# Patient Record
Sex: Female | Born: 1935 | Race: White | Hispanic: No | Smoking: Former smoker
Health system: Southern US, Community
[De-identification: ages and names within clinical notes are randomized; demographics above are authoritative.]

## PROBLEM LIST (undated history)

## (undated) DIAGNOSIS — I739 Peripheral vascular disease, unspecified: Secondary | ICD-10-CM

## (undated) DIAGNOSIS — M109 Gout, unspecified: Secondary | ICD-10-CM

## (undated) DIAGNOSIS — M1990 Unspecified osteoarthritis, unspecified site: Secondary | ICD-10-CM

## (undated) DIAGNOSIS — Z8349 Family history of other endocrine, nutritional and metabolic diseases: Secondary | ICD-10-CM

## (undated) DIAGNOSIS — I519 Heart disease, unspecified: Secondary | ICD-10-CM

## (undated) DIAGNOSIS — I2510 Atherosclerotic heart disease of native coronary artery without angina pectoris: Secondary | ICD-10-CM

## (undated) DIAGNOSIS — E039 Hypothyroidism, unspecified: Secondary | ICD-10-CM

## (undated) DIAGNOSIS — E785 Hyperlipidemia, unspecified: Secondary | ICD-10-CM

## (undated) DIAGNOSIS — K5792 Diverticulitis of intestine, part unspecified, without perforation or abscess without bleeding: Secondary | ICD-10-CM

## (undated) DIAGNOSIS — I209 Angina pectoris, unspecified: Secondary | ICD-10-CM

## (undated) DIAGNOSIS — R0602 Shortness of breath: Secondary | ICD-10-CM

## (undated) DIAGNOSIS — I10 Essential (primary) hypertension: Secondary | ICD-10-CM

## (undated) HISTORY — PX: CATARACT EXTRACTION W/ INTRAOCULAR LENS  IMPLANT, BILATERAL: SHX1307

## (undated) HISTORY — PX: ANGIOPLASTY: SHX39

## (undated) HISTORY — PX: APPENDECTOMY: SHX54

## (undated) HISTORY — DX: Family history of other endocrine, nutritional and metabolic diseases: Z83.49

## (undated) HISTORY — DX: Heart disease, unspecified: I51.9

## (undated) HISTORY — DX: Diverticulitis of intestine, part unspecified, without perforation or abscess without bleeding: K57.92

## (undated) HISTORY — DX: Essential (primary) hypertension: I10

---
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
## 2011-07-03 LAB — LIPID PANEL
Cholesterol: 161 mg/dL (ref 0–200)
HDL: 38 mg/dL (ref 35–70)
LDL Cholesterol: 82 mg/dL
Triglycerides: 206 mg/dL — AB (ref 40–160)

## 2012-04-01 LAB — BASIC METABOLIC PANEL: Potassium: 4.1 mmol/L (ref 3.4–5.3)

## 2012-10-04 ENCOUNTER — Encounter: Payer: Self-pay | Admitting: Family Medicine

## 2012-10-04 ENCOUNTER — Ambulatory Visit (INDEPENDENT_AMBULATORY_CARE_PROVIDER_SITE_OTHER): Payer: Medicare Other | Admitting: Family Medicine

## 2012-10-04 VITALS — BP 117/55 | HR 78 | Ht <= 58 in | Wt 143.0 lb

## 2012-10-04 DIAGNOSIS — M1710 Unilateral primary osteoarthritis, unspecified knee: Secondary | ICD-10-CM | POA: Insufficient documentation

## 2012-10-04 DIAGNOSIS — I2510 Atherosclerotic heart disease of native coronary artery without angina pectoris: Secondary | ICD-10-CM | POA: Insufficient documentation

## 2012-10-04 DIAGNOSIS — E039 Hypothyroidism, unspecified: Secondary | ICD-10-CM

## 2012-10-04 DIAGNOSIS — I10 Essential (primary) hypertension: Secondary | ICD-10-CM | POA: Insufficient documentation

## 2012-10-04 DIAGNOSIS — E785 Hyperlipidemia, unspecified: Secondary | ICD-10-CM

## 2012-10-04 DIAGNOSIS — Z79899 Other long term (current) drug therapy: Secondary | ICD-10-CM

## 2012-10-04 NOTE — Progress Notes (Signed)
CC: Natalie Hogan is a 79 y.o. female is here for Establish Care   Subjective: HPI:  Pleasant 79 year old here to establish care, temporary living here with family, may move back to [place] soon. Seen in local emergency room 2 weeks ago for diarrhea which resolved 5 days ago now having one daily bowel movement without blood or tar-like appearance  History of heart disease, she has trouble describing specifics but it sounds like a diagnosis of coronary artery disease. Currently taking metoprolol and isosorbide daily. Baby aspirin a day. Denies exertional chest pain nor limb claudication.  History of hypothyroidism taking 25 mcg levothyroxine a day. Denies unintentional weight loss or gain, diarrhea, constipation, anxiety, depression, tremor  History of hyperlipidemia. Has been taking Zocor for over a year, last cholesterol panel over a year ago. Denies right upper quadrant pain, skin or scleral discoloration, nor myalgias  Complains of bilateral knee pain for years. Described as a soreness the more she walks improves with rest. Denies catching locking or giving way of either knee.  Had a cortisone shot 6 months ago with mild improvement. Pain is now mild to moderate without any swelling or redness nor warmth. Denies weakness or joint pain elsewhere  Review of Systems - General ROS: negative for - chills, fever, night sweats, weight gain or weight loss Ophthalmic ROS: negative for - decreased vision Psychological ROS: negative for - anxiety or depression ENT ROS: negative for - hearing change, nasal congestion, tinnitus or allergies Hematological and Lymphatic ROS: negative for - bleeding problems, bruising or swollen lymph nodes Breast ROS: negative Respiratory ROS: no cough, shortness of breath, or wheezing Cardiovascular ROS: no chest pain or dyspnea on exertion Gastrointestinal ROS: no abdominal pain, change in bowel habits, or black or bloody stools Genito-Urinary ROS: negative for  - genital discharge, genital ulcers, incontinence or abnormal bleeding from genitals Musculoskeletal ROS: negative for - joint pain or muscle pain other than that described above Neurological ROS: negative for - headaches or memory loss Dermatological ROS: negative for lumps, mole changes, rash and skin lesion changes  Past Medical History  Diagnosis Date  . Heart disease   . Hypertension   . Family history of thyroid problem      Family History  Problem Relation Age of Onset  . Hypertension Son      History  Substance Use Topics  . Smoking status: Former Smoker    Quit date: 10/05/1967  . Smokeless tobacco: Not on file  . Alcohol Use: No     Objective: Filed Vitals:   10/04/12 1123  BP: 117/55  Pulse: 78    General: Alert and Oriented, No Acute Distress HEENT: Pupils equal, round, reactive to light. Conjunctivae clear. Moist mucous membranes Lungs: Clear to auscultation bilaterally, no wheezing/ronchi/rales.  Comfortable work of breathing. Good air movement. Cardiac: Regular rate and rhythm. Normal S1/S2.  No murmurs, rubs, nor gallops.   Abdomen: Normal bowel sounds, soft and non tender without palpable masses. Extremities: No peripheral edema.  Strong peripheral pulses. Both knees have full range of motion and strength with no pain on palpation no valgus or varus laxity, no crepitus, no swelling redness or warmth Mental Status: No depression, anxiety, nor agitation. Skin: Warm and dry.  Assessment & Plan: Natalie Hogan was seen today for establish care.  Diagnoses and associated orders for this visit:  CAD (coronary artery disease)  Essential hypertension, benign - COMPLETE METABOLIC PANEL WITH GFR  Hypothyroidism - TSH  Knee osteoarthritis  Hyperlipidemia LDL goal <70 -  Lipid panel  Encounter for monitoring statin therapy - Lipid panel - COMPLETE METABOLIC PANEL WITH GFR  Other Orders - Isosorbide Mononitrate (ISMO PO); Take by mouth. - simvastatin (ZOCOR)  20 MG tablet; Take 20 mg by mouth every evening. - metoprolol succinate (TOPROL-XL) 25 MG 24 hr tablet; Take 25 mg by mouth daily. - levothyroxine (SYNTHROID, LEVOTHROID) 25 MCG tablet; Take 25 mcg by mouth daily before breakfast. - celecoxib (CELEBREX) 200 MG capsule; Take 200 mg by mouth 2 (two) times daily. - aspirin 81 MG tablet; Take 81 mg by mouth daily.    Hypothyroidism: Clinically controlled, due for TSH Essential hypertension: Controlled continue current med regimen Coronary artery disease: Controlled, continue nitrates and metoprolol Hyperlipidemia: Due for lipid profile, checking liver function Bilateral knee osteoarthritis: Patient would like bilateral steroid injection for pain management  Return in about 3 months (around 01/04/2013).  Knee Injection Procedure Note  Pre-operative Diagnosis: bilateral osteoarthritis  Post-operative Diagnosis: same  Indications: pain  Anesthesia: topical cold spray  Procedure Details   Verbal consent was obtained for the procedure. The joints was prepped with alcohol. A 22 gauge needle was inserted into the inferior medial (right knee) and inferior lateral (left knee) of the joint. 3 ml 1% lidocaine and 2 ml of triamcinolone (KENALOG) 40mg /ml was then injected into the joint through the same needle. The needle was removed and the area cleansed and dressed.  Complications:  None; patient tolerated the procedure well.

## 2012-10-12 ENCOUNTER — Inpatient Hospital Stay (HOSPITAL_BASED_OUTPATIENT_CLINIC_OR_DEPARTMENT_OTHER)
Admission: EM | Admit: 2012-10-12 | Discharge: 2012-10-14 | DRG: 683 | Disposition: A | Discharge status: Home / Self Care | Payer: Medicare Other | Attending: Internal Medicine | Admitting: Internal Medicine

## 2012-10-12 ENCOUNTER — Emergency Department (HOSPITAL_BASED_OUTPATIENT_CLINIC_OR_DEPARTMENT_OTHER): Payer: Medicare Other

## 2012-10-12 ENCOUNTER — Encounter: Payer: Self-pay | Admitting: *Deleted

## 2012-10-12 ENCOUNTER — Emergency Department (INDEPENDENT_AMBULATORY_CARE_PROVIDER_SITE_OTHER)
Admission: EM | Admit: 2012-10-12 | Discharge: 2012-10-12 | Disposition: A | Discharge status: Home / Self Care | Payer: Medicare Other | Source: Home / Self Care | Attending: Family Medicine | Admitting: Family Medicine

## 2012-10-12 ENCOUNTER — Other Ambulatory Visit: Payer: Self-pay | Admitting: Family Medicine

## 2012-10-12 ENCOUNTER — Encounter (HOSPITAL_BASED_OUTPATIENT_CLINIC_OR_DEPARTMENT_OTHER): Payer: Self-pay

## 2012-10-12 ENCOUNTER — Ambulatory Visit (HOSPITAL_BASED_OUTPATIENT_CLINIC_OR_DEPARTMENT_OTHER): Admit: 2012-10-12 | Payer: Medicare Other

## 2012-10-12 DIAGNOSIS — D696 Thrombocytopenia, unspecified: Secondary | ICD-10-CM | POA: Diagnosis present

## 2012-10-12 DIAGNOSIS — R197 Diarrhea, unspecified: Secondary | ICD-10-CM

## 2012-10-12 DIAGNOSIS — E039 Hypothyroidism, unspecified: Secondary | ICD-10-CM | POA: Diagnosis present

## 2012-10-12 DIAGNOSIS — Z87891 Personal history of nicotine dependence: Secondary | ICD-10-CM

## 2012-10-12 DIAGNOSIS — E875 Hyperkalemia: Secondary | ICD-10-CM

## 2012-10-12 DIAGNOSIS — R109 Unspecified abdominal pain: Secondary | ICD-10-CM

## 2012-10-12 DIAGNOSIS — K5289 Other specified noninfective gastroenteritis and colitis: Secondary | ICD-10-CM | POA: Diagnosis present

## 2012-10-12 DIAGNOSIS — E872 Acidosis, unspecified: Secondary | ICD-10-CM

## 2012-10-12 DIAGNOSIS — E785 Hyperlipidemia, unspecified: Secondary | ICD-10-CM | POA: Diagnosis present

## 2012-10-12 DIAGNOSIS — N289 Disorder of kidney and ureter, unspecified: Secondary | ICD-10-CM

## 2012-10-12 DIAGNOSIS — D649 Anemia, unspecified: Secondary | ICD-10-CM

## 2012-10-12 DIAGNOSIS — I2510 Atherosclerotic heart disease of native coronary artery without angina pectoris: Secondary | ICD-10-CM | POA: Diagnosis present

## 2012-10-12 DIAGNOSIS — I10 Essential (primary) hypertension: Secondary | ICD-10-CM | POA: Diagnosis present

## 2012-10-12 DIAGNOSIS — N179 Acute kidney failure, unspecified: Principal | ICD-10-CM

## 2012-10-12 DIAGNOSIS — I519 Heart disease, unspecified: Secondary | ICD-10-CM | POA: Diagnosis present

## 2012-10-12 HISTORY — DX: Angina pectoris, unspecified: I20.9

## 2012-10-12 HISTORY — DX: Shortness of breath: R06.02

## 2012-10-12 HISTORY — DX: Hypothyroidism, unspecified: E03.9

## 2012-10-12 HISTORY — DX: Unspecified osteoarthritis, unspecified site: M19.90

## 2012-10-12 HISTORY — DX: Peripheral vascular disease, unspecified: I73.9

## 2012-10-12 HISTORY — DX: Hyperlipidemia, unspecified: E78.5

## 2012-10-12 HISTORY — DX: Gout, unspecified: M10.9

## 2012-10-12 HISTORY — DX: Atherosclerotic heart disease of native coronary artery without angina pectoris: I25.10

## 2012-10-12 LAB — POCT URINALYSIS DIP (MANUAL ENTRY)
Bilirubin, UA: NEGATIVE
Glucose, UA: NEGATIVE
Nitrite, UA: NEGATIVE
Urobilinogen, UA: 0.2 (ref 0–1)
pH, UA: 5.5 (ref 5–8)

## 2012-10-12 LAB — POCT CBC W AUTO DIFF (K'VILLE URGENT CARE)

## 2012-10-12 LAB — CBC WITH DIFFERENTIAL/PLATELET
Basophils Absolute: 0 10*3/uL (ref 0.0–0.1)
Basophils Relative: 0 % (ref 0–1)
Eosinophils Absolute: 0 10*3/uL (ref 0.0–0.7)
MCH: 32.6 pg (ref 26.0–34.0)
MCHC: 33.5 g/dL (ref 30.0–36.0)
Monocytes Relative: 9 % (ref 3–12)
Neutro Abs: 5.3 10*3/uL (ref 1.7–7.7)
Neutrophils Relative %: 70 % (ref 43–77)
RDW: 15.4 % (ref 11.5–15.5)

## 2012-10-12 LAB — COMPREHENSIVE METABOLIC PANEL
AST: 30 U/L (ref 0–37)
Albumin: 4.1 g/dL (ref 3.5–5.2)
Albumin: 3.9 g/dL (ref 3.5–5.2)
Alkaline Phosphatase: 100 U/L (ref 39–117)
BUN: 85 mg/dL — ABNORMAL HIGH (ref 6–23)
BUN: 87 mg/dL — ABNORMAL HIGH (ref 6–23)
CO2: 18 mEq/L — ABNORMAL LOW (ref 19–32)
Calcium: 8.8 mg/dL (ref 8.4–10.5)
Chloride: 111 mEq/L (ref 96–112)
Chloride: 103 mEq/L (ref 96–112)
Creatinine, Ser: 2.1 mg/dL — ABNORMAL HIGH (ref 0.50–1.10)
Glucose, Bld: 96 mg/dL (ref 70–99)
Potassium: 6.9 mEq/L (ref 3.5–5.3)
Potassium: 6.7 mEq/L (ref 3.5–5.1)
Sodium: 136 mEq/L (ref 135–145)
Total Bilirubin: 0.4 mg/dL (ref 0.3–1.2)
Total Protein: 6.4 g/dL (ref 6.0–8.3)
Total Protein: 7 g/dL (ref 6.0–8.3)

## 2012-10-12 LAB — CBC
MCH: 32.5 pg (ref 26.0–34.0)
MCV: 95.3 fL (ref 78.0–100.0)
Platelets: 124 10*3/uL — ABNORMAL LOW (ref 150–400)
RBC: 3.42 MIL/uL — ABNORMAL LOW (ref 3.87–5.11)

## 2012-10-12 LAB — BASIC METABOLIC PANEL
BUN: 74 mg/dL — ABNORMAL HIGH (ref 6–23)
CO2: 17 mEq/L — ABNORMAL LOW (ref 19–32)
Calcium: 8.8 mg/dL (ref 8.4–10.5)
Glucose, Bld: 98 mg/dL (ref 70–99)
Sodium: 137 mEq/L (ref 135–145)

## 2012-10-12 LAB — OCCULT BLOOD X 1 CARD TO LAB, STOOL: Fecal Occult Bld: POSITIVE — AB

## 2012-10-12 LAB — CLOSTRIDIUM DIFFICILE BY PCR: Toxigenic C. Difficile by PCR: NEGATIVE

## 2012-10-12 MED ORDER — ASPIRIN 81 MG PO TABS: 81.0000 mg | ORAL_TABLET | Freq: Every day | ORAL | Status: DC

## 2012-10-12 MED ORDER — ONDANSETRON HCL 4 MG PO TABS: 4.0000 mg | ORAL_TABLET | Freq: Four times a day (QID) | ORAL | Status: DC | PRN

## 2012-10-12 MED ORDER — SODIUM BICARBONATE 8.4 % IV SOLN
INTRAVENOUS | Status: AC
  Administered 2012-10-12: 18:00:00
  Filled 2012-10-12: qty 100

## 2012-10-12 MED ORDER — SIMVASTATIN 20 MG PO TABS
20.0000 mg | ORAL_TABLET | Freq: Every evening | ORAL | Status: DC
  Administered 2012-10-12 – 2012-10-14 (×3): 20 mg via ORAL
  Filled 2012-10-12 (×3): qty 1

## 2012-10-12 MED ORDER — ACETAMINOPHEN 325 MG PO TABS: 650.0000 mg | ORAL_TABLET | Freq: Four times a day (QID) | ORAL | Status: DC | PRN

## 2012-10-12 MED ORDER — SODIUM CHLORIDE 0.9 % IJ SOLN
3.0000 mL | Freq: Two times a day (BID) | INTRAMUSCULAR | Status: DC
  Administered 2012-10-12 – 2012-10-14 (×3): 3 mL via INTRAVENOUS

## 2012-10-12 MED ORDER — SODIUM POLYSTYRENE SULFONATE 15 GM/60ML PO SUSP
30.0000 g | Freq: Once | ORAL | Status: AC
  Administered 2012-10-12: 30 g via ORAL
  Filled 2012-10-12: qty 120

## 2012-10-12 MED ORDER — DEXTROSE 50 % IV SOLN
1.0000 | Freq: Once | INTRAVENOUS | Status: AC
  Administered 2012-10-12: 50 mL via INTRAVENOUS
  Filled 2012-10-12: qty 50

## 2012-10-12 MED ORDER — ACETAMINOPHEN 650 MG RE SUPP: 650.0000 mg | Freq: Four times a day (QID) | RECTAL | Status: DC | PRN

## 2012-10-12 MED ORDER — LEVOTHYROXINE SODIUM 25 MCG PO TABS
25.0000 ug | ORAL_TABLET | Freq: Every day | ORAL | Status: DC
  Administered 2012-10-13 – 2012-10-14 (×2): 25 ug via ORAL
  Filled 2012-10-12 (×3): qty 1

## 2012-10-12 MED ORDER — ONDANSETRON HCL 4 MG/2ML IJ SOLN: 4.0000 mg | Freq: Three times a day (TID) | INTRAMUSCULAR | Status: DC | PRN

## 2012-10-12 MED ORDER — METOPROLOL SUCCINATE ER 25 MG PO TB24
25.0000 mg | ORAL_TABLET | Freq: Every day | ORAL | Status: DC
  Administered 2012-10-13: 25 mg via ORAL
  Filled 2012-10-12 (×2): qty 1

## 2012-10-12 MED ORDER — ONDANSETRON HCL 4 MG/2ML IJ SOLN: 4.0000 mg | Freq: Four times a day (QID) | INTRAMUSCULAR | Status: DC | PRN

## 2012-10-12 MED ORDER — ISOSORBIDE MONONITRATE ER 30 MG PO TB24
30.0000 mg | ORAL_TABLET | Freq: Every day | ORAL | Status: DC
  Administered 2012-10-13 – 2012-10-14 (×2): 30 mg via ORAL
  Filled 2012-10-12 (×2): qty 1

## 2012-10-12 MED ORDER — INSULIN ASPART 100 UNIT/ML IV SOLN
10.0000 [IU] | Freq: Once | INTRAVENOUS | Status: AC
  Administered 2012-10-12: 10 [IU] via INTRAVENOUS
  Filled 2012-10-12: qty 1
  Filled 2012-10-12: qty 0.1

## 2012-10-12 MED ORDER — SODIUM BICARBONATE 8.4 % IV SOLN
INTRAVENOUS | Status: AC
  Administered 2012-10-12: 18:00:00
  Filled 2012-10-12: qty 50

## 2012-10-12 MED ORDER — NITROGLYCERIN 0.4 MG/SPRAY TL SOLN
1.0000 | Status: DC | PRN
  Filled 2012-10-12: qty 4.9

## 2012-10-12 MED ORDER — STERILE WATER FOR INJECTION IV SOLN
INTRAVENOUS | Status: DC
  Administered 2012-10-12 – 2012-10-13 (×3): via INTRAVENOUS
  Filled 2012-10-12 (×2): qty 850

## 2012-10-12 MED ORDER — ASPIRIN EC 81 MG PO TBEC
81.0000 mg | DELAYED_RELEASE_TABLET | Freq: Every day | ORAL | Status: DC
  Administered 2012-10-13 – 2012-10-14 (×2): 81 mg via ORAL
  Filled 2012-10-12 (×2): qty 1

## 2012-10-12 MED ORDER — CIPROFLOXACIN HCL 500 MG PO TABS: 500.0000 mg | ORAL_TABLET | Freq: Two times a day (BID) | ORAL | Status: DC

## 2012-10-12 MED ORDER — HYDRALAZINE HCL 20 MG/ML IJ SOLN: 10.0000 mg | INTRAMUSCULAR | Status: DC | PRN

## 2012-10-12 MED ORDER — SODIUM CHLORIDE 0.9 % IV BOLUS (SEPSIS)
1000.0000 mL | Freq: Once | INTRAVENOUS | Status: AC
  Administered 2012-10-12: 1000 mL via INTRAVENOUS

## 2012-10-12 NOTE — ED Notes (Signed)
Report given to anita rn on 6700 at [facility]. pts son was called per his request and notified of pts assigned room number and that she was on her way with [facility].

## 2012-10-12 NOTE — ED Provider Notes (Addendum)
History     CSN: [id number]  Arrival date & time 10/12/12  7829   First MD Initiated Contact with Patient 10/12/12 [id number]      Chief Complaint  Patient presents with  . Diarrhea    HPI  This is a 79 year old female with past medical history of hypertension, hypothyroidism, heart disease who presents today with intermittent diarrhea x2 weeks. Patient states she's had intermittent loose/watery bowel movements that have occurred Thursday throughout the day. Patiently recently relocated to the area from [place]. Was recently seen at primary care across the hall. There is no issues at that time. Patient denies any fevers or chills, no nausea or vomiting. No recent foreign travel. Patient has had some mild flank pain as well as some intermittent dysuria. No unintentional weight loss. Stools have been watery in nature nonbloody. This is never happened before. Patient denies any prior history of GI issues. Patient has had multiple abdominal surgeries including appendectomy and hysterectomy. Patient reports she's never had a colonoscopy.  Past Medical History  Diagnosis Date  . Heart disease   . Hypertension   . Family history of thyroid problem     History reviewed. No pertinent past surgical history.  Family History  Problem Relation Age of Onset  . Hypertension Son     History  Substance Use Topics  . Smoking status: Former Smoker    Quit date: 10/05/1967  . Smokeless tobacco: Never Used  . Alcohol Use: No    OB History   Grav Para Term Preterm Abortions TAB SAB Ect Mult Living                  Review of Systems  All other systems reviewed and are negative.    Allergies  Review of patient's allergies indicates no known allergies.  Home Medications   Current Outpatient Rx  Name  Route  Sig  Dispense  Refill  . aspirin 81 MG tablet   Oral   Take 81 mg by mouth daily.         . celecoxib (CELEBREX) 200 MG capsule   Oral   Take 200 mg by mouth 2  (two) times daily.         . Isosorbide Mononitrate (ISMO PO)   Oral   Take by mouth.         . levothyroxine (SYNTHROID, LEVOTHROID) 25 MCG tablet   Oral   Take 25 mcg by mouth daily before breakfast.         . metoprolol succinate (TOPROL-XL) 25 MG 24 hr tablet   Oral   Take 25 mg by mouth daily.         . simvastatin (ZOCOR) 20 MG tablet   Oral   Take 20 mg by mouth every evening.           BP 131/76  Pulse 75  Temp(Src) 97.8 F (36.6 C) (Oral)  Resp 14  Ht 4\' 10"  (1.473 m)  Wt 139 lb (63.05 kg)  BMI 29.06 kg/m2  SpO2 98%  Physical Exam  Constitutional: She appears well-developed and well-nourished.  HENT:  Head: Normocephalic and atraumatic.  Eyes: Pupils are equal, round, and reactive to light.  Neck: Normal range of motion.  Cardiovascular: Normal rate and regular rhythm.   Pulmonary/Chest: Effort normal and breath sounds normal.  Abdominal: Soft. Bowel sounds are normal.  Mild abdominal distention. No dominant tenderness. Mild bilateral flank pain.  Musculoskeletal: Normal range of motion.  Neurological: She is alert.  Skin: Skin is warm.    ED Course  Procedures (including critical care time)  Labs Reviewed - No data to display No results found. Urinalysis    Component Value Date/Time   BILIRUBINUR negative 10/12/2012 0900   BILIRUBINUR negative 10/12/2012 0900   UROBILINOGEN 0.2 10/12/2012 0900   NITRITE Negative 10/12/2012 0900   LEUKOCYTESUR Trace 10/12/2012 0900       1. Flank pain   2. Diarrhea       MDM  Differential diagnosis for symptoms remains relatively broad including infection, obstruction, medication, malignancy. CBC WNL today.  Mild normocytic anemia with hgb at 11.1 (MCV 98) Will obtain CT scan of the abdomen and pelvis with IV and oral contrast to further assess intra-abdominal anatomy. UA is somewhat indicative of urinary tract infection. Will obtain urine culture. Start patient on Cipro for GU coverage. Case  was discussed with Dr. Rhetta Mura with digestive health specialists and Kathryne Sharper. Agree with plan. We'll hold off on obtaining any stool studies pending CT of the abdomen and pelvis. Plan for followup with gastroenterology within next 1-3 days. Discussed general, gastrointestinal, infectious red flags at length with patient. Followup as needed.     The patient and/or caregiver has been counseled thoroughly with regard to treatment plan and/or medications prescribed including dosage, schedule, interactions, rationale for use, and possible side effects and they verbalize understanding. Diagnoses and expected course of recovery discussed and will return if not improved as expected or if the condition worsens. Patient and/or caregiver verbalized understanding.             Doree Albee, MD 10/12/12 0933   Clinical update:  BMET    Component Value Date/Time   NA 136 10/12/2012 0902   K 6.9* 10/12/2012 0902   CL 111 10/12/2012 0902   CO2 18* 10/12/2012 0902   GLUCOSE 96 10/12/2012 0902   BUN 85* 10/12/2012 0902   CREATININE 2.21* 10/12/2012 0902   CALCIUM 8.8 10/12/2012 0902   Pt with noted hyperkalemia, renal failure, as well as bicarb 18(? Met acidosis).  Hold on CT scan.  Pt's son made aware. Instructed to go to ER next door for further management of this.     Doree Albee, MD 10/12/12 1254  Doree Albee, MD 10/12/12 1255

## 2012-10-12 NOTE — ED Notes (Signed)
Pt reports a 2 week history of diarrhea and abdominal cramping.  She was seen by [facility] today, sent for an outpatient CT and sent to ED for evaluation of renal failure and hyperkalemia.

## 2012-10-12 NOTE — ED Provider Notes (Signed)
History     CSN: [id number]  Arrival date & time 10/12/12  1254   First MD Initiated Contact with Patient 10/12/12 1312      Chief Complaint  Patient presents with  . Diarrhea  . Abdominal Cramping    (Consider location/radiation/quality/duration/timing/severity/associated sxs/prior treatment) HPI 79 y.o. Female complaining of diarrhea for three weeks.  Loose stools 2-3 times per day now very watery.  No fever or chills, no recent antibiotic use, crampy lower abdominal pain with gas and low back pain.  Taking po well, but 10 pound weight loss.   Past Medical History  Diagnosis Date  . Heart disease   . Hypertension   . Family history of thyroid problem     History reviewed. No pertinent past surgical history.  Family History  Problem Relation Age of Onset  . Hypertension Son     History  Substance Use Topics  . Smoking status: Former Smoker    Quit date: 10/05/1967  . Smokeless tobacco: Never Used  . Alcohol Use: No    OB History   Grav Para Term Preterm Abortions TAB SAB Ect Mult Living                  Review of Systems  All other systems reviewed and are negative.    Allergies  Review of patient's allergies indicates no known allergies.  Home Medications   Current Outpatient Rx  Name  Route  Sig  Dispense  Refill  . aspirin 81 MG tablet   Oral   Take 81 mg by mouth daily.         . celecoxib (CELEBREX) 200 MG capsule   Oral   Take 200 mg by mouth 2 (two) times daily.         . ciprofloxacin (CIPRO) 500 MG tablet   Oral   Take 1 tablet (500 mg total) by mouth 2 (two) times daily.   14 tablet   0   . Isosorbide Mononitrate (ISMO PO)   Oral   Take by mouth.         . levothyroxine (SYNTHROID, LEVOTHROID) 25 MCG tablet   Oral   Take 25 mcg by mouth daily before breakfast.         . metoprolol succinate (TOPROL-XL) 25 MG 24 hr tablet   Oral   Take 25 mg by mouth daily.         . simvastatin (ZOCOR) 20 MG tablet   Oral  Take 20 mg by mouth every evening.           BP 148/113  Pulse 76  Temp(Src) 97.5 F (36.4 C) (Oral)  Resp 22  Ht 4\' 10"  (1.473 m)  Wt 139 lb (63.05 kg)  BMI 29.06 kg/m2  SpO2 100%  Physical Exam  Nursing note and vitals reviewed. Constitutional: She is oriented to person, place, and time. She appears well-developed and well-nourished.  HENT:  Head: Normocephalic and atraumatic.  Right Ear: External ear normal.  Left Ear: External ear normal.  Nose: Nose normal.  Mouth/Throat: Oropharynx is clear and moist.  Eyes: Conjunctivae and EOM are normal. Pupils are equal, round, and reactive to light.  Neck: Normal range of motion. Neck supple.  Cardiovascular: Normal rate, regular rhythm, normal heart sounds and intact distal pulses.   Pulmonary/Chest: Effort normal and breath sounds normal.  Abdominal: Soft. Bowel sounds are normal.  Musculoskeletal: Normal range of motion.  Neurological: She is alert and oriented to person, place, and time.  She has normal reflexes.  Skin: Skin is warm and dry.  Psychiatric: She has a normal mood and affect. Her behavior is normal. Thought content normal.    ED Course  Procedures (including critical care time)  Labs Reviewed - No data to display No results found.   No diagnosis found.   Date: 10/12/2012  Rate: 62  Rhythm: normal sinus rhythm  QRS Axis: normal  Intervals: normal  ST/T Wave abnormalities: normal  Conduction Disutrbances: none  Narrative Interpretation: unremarkable      MDM    Patient with abdominal discomfort, diarrhea, renal insufficiency, hyperkalemia, and renal mass.  Plan transfer for further treatment of hyperkalemia, evaluation of renal mass, volume repletion, and further evaluation of rectal bleeding. Patient discussed with Dr. Jerral Ralph and will accept to tele bed.  Patient given iv insulin d50 and bicarb drip started as per Dr. Windell Norfolk request.       Hilario Quarry, MD 10/12/12 [id number]

## 2012-10-12 NOTE — Progress Notes (Signed)
79 yo lady with CAD, Hypothyroidism with diarrhea-labs show ARF with Hyperkalemia and met acidosis. No EKG changes.Otherwise stable, ED MD Dr Rosalia Hammers gave patient Kayexalate-I asked Dr Rosalia Hammers to given one dose of IV insulin and also to place on Bicarb infusion. She has been accepted to Telemetry bed.Please recheck BMET when patient gets to [facility]. She is from [place]-and is here visiting her son.

## 2012-10-12 NOTE — ED Notes (Signed)
Patient transported to CT 

## 2012-10-12 NOTE — ED Notes (Signed)
Natalie Hogan c/o intermittent diarrhea and flank pain x 2 weeks. She was seen by Dr. Ivan Anchors 1 week ago to establish care and c/o the diarrhea. She has not had her baseline labs drawn yet. Denies fever.

## 2012-10-12 NOTE — H&P (Signed)
[facility] History and Physical  Natalie Hogan ZHY:[id number] DOB: September 12, 1917 DOA: 10/12/2012  Referring physician: ER physician at [facility]. PCP: Natalie Boom, DO  Chief Complaint: Diarrhea.  HPI: Natalie Hogan is a 79 y.o. female no history of hypertension, hypothyroidism, hyperlipidemia presented to the ER at [facility] with complaints of persistent diarrhea. Patient has been having multiple episodes of diarrhea for last 2 weeks. Denies any nausea vomiting fever chills. Denies having used any recent antibiotics. Patient has been hospitalized recently 2 months ago at [facility] for dizziness. Patient has been experiencing over the last 2 weeks left-sided dull pain. In the ER patient was found to have metabolic acidosis with hyperkalemia and renal failure. CT abdomen pelvis shows cholelithiasis and left renal mass. Patient has been admitted for further management. Patient was given Kayexalate and was started on bicarbonate drip. EKG did not show any acute changes. Patient denies any shortness of breath chest pain.  Review of Systems: As presented in the history of presenting illness, rest negative.  Past Medical History  Diagnosis Date  . Heart disease   . Hypertension   . Family history of thyroid problem   . Hypothyroidism   . Hyperlipidemia    Past Surgical History  Procedure Laterality Date  . Cesarean section    . Appendectomy     Social History:  reports that she quit smoking about 45 years ago. She has never used smokeless tobacco. She reports that she does not drink alcohol or use illicit drugs. Lives at home with her son. where does patient live-- Can do ADLs. Can patient participate in ADLs?  No Known Allergies  Family History  Problem Relation Age of Onset  . Hypertension Son       Prior to Admission medications   Medication Sig Start Date End Date Taking? Authorizing Provider  aspirin 81 MG tablet Take 81 mg by mouth daily.   Yes  Historical Provider, MD  celecoxib (CELEBREX) 200 MG capsule Take 200 mg by mouth 2 (two) times daily.   Yes Historical Provider, MD  ciprofloxacin (CIPRO) 500 MG tablet Take 1 tablet (500 mg total) by mouth 2 (two) times daily. 10/12/12  Yes Natalie Albee, MD  colchicine 0.6 MG tablet Take 0.6 mg by mouth daily.   Yes Historical Provider, MD  furosemide (LASIX) 20 MG tablet Take 20 mg by mouth daily.   Yes Historical Provider, MD  isosorbide mononitrate (IMDUR) 30 MG 24 hr tablet Take 30 mg by mouth daily.   Yes Historical Provider, MD  levothyroxine (SYNTHROID, LEVOTHROID) 25 MCG tablet Take 25 mcg by mouth daily before breakfast.   Yes Historical Provider, MD  metoprolol succinate (TOPROL-XL) 25 MG 24 hr tablet Take 25 mg by mouth daily.   Yes Historical Provider, MD  nitroGLYCERIN (NITROLINGUAL) 0.4 MG/SPRAY spray Place 1 spray under the tongue every 5 (five) minutes as needed for chest pain.   Yes Historical Provider, MD  simvastatin (ZOCOR) 20 MG tablet Take 20 mg by mouth every evening.   Yes Historical Provider, MD  valsartan (DIOVAN) 160 MG tablet Take 160 mg by mouth daily.   Yes Historical Provider, MD   Physical Exam: Filed Vitals:   10/12/12 1306 10/12/12 1515 10/12/12 1746  BP: 148/113 146/49 152/67  Pulse: 76 64   Temp: 97.5 F (36.4 C) 97.3 F (36.3 C)   TempSrc: Oral Oral   Resp: 22 16 18   Height: 4\' 10"  (1.473 m)    Weight: 63.05 kg (139 lb)  SpO2: 100% 100% 100%     General:  Well-developed well-nourished.  Eyes: Anicteric no pallor.  ENT: No discharge from the ears eyes nose mouth.  Neck: No mass felt.  Cardiovascular: S1-S2 heard.  Respiratory: No rhonchi or crepitations.  Abdomen: Mildly distended but nontender. Bowel sounds present. No guarding rigidity.  Skin: No rash.  Musculoskeletal: No edema.  Psychiatric: Appears normal.  Neurologic: Alert awake oriented to time place and person. Moves all extremities.  Labs on Admission:  Basic  Metabolic Panel:  Recent Labs Lab 10/12/12 0902 10/12/12 1400  NA 136 132*  K 6.9* 6.7*  CL 111 103  CO2 18* 14*  GLUCOSE 96 87  BUN 85* 87*  CREATININE 2.21* 2.10*  CALCIUM 8.8 9.1   Liver Function Tests:  Recent Labs Lab 10/12/12 0902 10/12/12 1400  AST 24 30  ALT 28 27  ALKPHOS 95 100  BILITOT 0.5 0.4  PROT 6.4 7.0  ALBUMIN 4.1 3.9   No results found for this basename: LIPASE, AMYLASE,  in the last 168 hours No results found for this basename: AMMONIA,  in the last 168 hours CBC:  Recent Labs Lab 10/12/12 1400 10/12/12 2050  WBC 7.6 6.8  NEUTROABS 5.3  --   HGB 11.6* 11.1*  HCT 34.6* 32.6*  MCV 97.2 95.3  PLT 124* 124*   Cardiac Enzymes: No results found for this basename: CKTOTAL, CKMB, CKMBINDEX, TROPONINI,  in the last 168 hours  BNP (last 3 results) No results found for this basename: PROBNP,  in the last 8760 hours CBG: No results found for this basename: GLUCAP,  in the last 168 hours  Radiological Exams on Admission: Ct Abdomen Pelvis Wo Contrast  10/12/2012   *RADIOLOGY REPORT*  Clinical Data: Diarrhea and left lower quadrant pain  CT ABDOMEN AND PELVIS WITHOUT CONTRAST  Technique:  Multidetector CT imaging of the abdomen and pelvis was performed following the standard protocol without intravenous contrast.  Comparison: None.  Findings: Lung bases are well aerated without evidence of focal infiltrate or sizable effusion.  A few small tiny 2-3 mm nodular densities are noted within the bases bilaterally.  The liver is well visualized demonstrates some calcified granulomas.  Similar findings are noted within the spleen. Multiple small dependent gallstones are noted.  The adrenal glands are unremarkable.  A small hiatal hernia is seen.  The pancreas is within normal limits.  The kidneys are well visualized and reveal no evidence of renal calculi or obstructive changes.  There is a 4.6 cm cystic lesion within the left kidney although it demonstrates at least  three areas of mural soft tissue density.  These changes are highly suspicious for a renal neoplasm.  Mild diverticular change is seen without diverticulitis.  The appendix is not visualized consistent with the clinical history. The bony structures show degenerative change of the lumbar spine.  IMPRESSION: Cystic mass lesion within the left kidney as described.  This is highly suspicious for renal neoplasm. The patient has an elevated creatinine precluding contrast administration.  An MRI of the kidneys may be of further assistance in clarifying the nature of this lesion.  Cholelithiasis.  Diverticulosis without diverticulitis.  Multiple tiny nodular densities within the chest. CT of the chest on any non emergent basis is recommended   Original Report Authenticated By: Alcide Clever, M.D.    EKG: Independently reviewed. Normal sinus rhythm.  Assessment/Plan Principal Problem:   Acute renal failure Active Problems:   Essential hypertension, benign   Hypothyroidism  Hyperlipidemia LDL goal <70   Metabolic acidosis   Hyperkalemia   Diarrhea   1. Acute renal failure with metabolic acidosis - probably precipitated by diarrhea and patient being on Cozaar and Lasix for the worsening it. At this time we will hold off Cozaar Lasix and continue the bicarbonate drip. Closely follow intake output metabolic panel. Check UA. CT does not show any hydronephrosis. Patient had already sealed Kayexalate in the ER for hyperkalemia. There is no EKG changes. 2. Diarrhea - probably gastroenteritis. Stool studies including C. difficile and cultures have been ordered. Stool for occult blood was positive closely follow CBC. 3. Left renal mass - MRI abdomen without contrast has been ordered for further study. 4. Mild thrombocytopenia - closely follow CBC. Patient is presently afebrile. 5. Hypertension - holding off Cozaar Lasix and patient has been placed on when necessary hydralazine for systolic blood pressure more than  160. 6. Hypothyroidism - continue Synthroid. Check TSH. 7. Hyperlipidemia - continue present medications.    Code Status: Full code.  Family Communication: Patient's son at the bedside.  Disposition Plan: Admit to inpatient.    Mariko Nowakowski N. [facility] Pager [phone].  If 7PM-7AM, please contact night-coverage [email or web address] Password [facility] 10/12/2012, 9:24 PM

## 2012-10-13 ENCOUNTER — Inpatient Hospital Stay (HOSPITAL_COMMUNITY): Payer: Medicare Other

## 2012-10-13 DIAGNOSIS — I10 Essential (primary) hypertension: Secondary | ICD-10-CM

## 2012-10-13 LAB — CBC WITH DIFFERENTIAL/PLATELET
Hemoglobin: 9.7 g/dL — ABNORMAL LOW (ref 12.0–15.0)
Lymphocytes Relative: 31 % (ref 12–46)
Lymphs Abs: 1.6 10*3/uL (ref 0.7–4.0)
Monocytes Relative: 9 % (ref 3–12)
Neutro Abs: 3.2 10*3/uL (ref 1.7–7.7)
Neutrophils Relative %: 60 % (ref 43–77)
RBC: 3.02 MIL/uL — ABNORMAL LOW (ref 3.87–5.11)

## 2012-10-13 LAB — URINALYSIS, ROUTINE W REFLEX MICROSCOPIC
Hgb urine dipstick: NEGATIVE
Ketones, ur: NEGATIVE mg/dL
Protein, ur: NEGATIVE mg/dL
Urobilinogen, UA: 0.2 mg/dL (ref 0.0–1.0)

## 2012-10-13 LAB — URINE CULTURE: Colony Count: 40000

## 2012-10-13 LAB — COMPREHENSIVE METABOLIC PANEL
AST: 20 U/L (ref 0–37)
CO2: 20 mEq/L (ref 19–32)
Calcium: 8.1 mg/dL — ABNORMAL LOW (ref 8.4–10.5)
Creatinine, Ser: 1.69 mg/dL — ABNORMAL HIGH (ref 0.50–1.10)
GFR calc Af Amer: 29 mL/min — ABNORMAL LOW (ref >90)
GFR calc non Af Amer: 25 mL/min — ABNORMAL LOW (ref >90)
Glucose, Bld: 84 mg/dL (ref 70–99)

## 2012-10-13 MED ORDER — LORAZEPAM 2 MG/ML IJ SOLN
1.0000 mg | Freq: Once | INTRAMUSCULAR | Status: AC
  Administered 2012-10-13: 2 mg via INTRAVENOUS
  Filled 2012-10-13: qty 1

## 2012-10-13 NOTE — Progress Notes (Signed)
[facility] PROGRESS NOTE  Natalie Hogan ZOX:[id number] DOB: May 26, 1917 DOA: 10/12/2012 PCP: Laren Boom, DO  Assessment/Plan: 1.  1. Acute renal failure with metabolic acidosis - probably precipitated by diarrhea and patient being on Cozaar and Lasix for the worsening it. At this time we will hold off Cozaar Lasix and continue the bicarbonate drip. Closely follow intake output metabolic panel.  CT does not show any hydronephrosis. Patient had already sealed Kayexalate in the ER for hyperkalemia. There is no EKG changes. UA is negative.   2. Diarrhea - probably gastroenteritis. Stool studies including C. difficile and cultures have been ordered. Stool for occult blood was positive closely follow CBC. cdiff pcr negative.    3. Left renal mass - MRI abdomen without contrast has been ordered for further study.   4. Mild thrombocytopenia - closely follow CBC.    Hypertension - holding off Cozaar Lasix and patient has been placed on when necessary hydralazine for systolic blood pressure more than 160   Hypothyroidism - continue Synthroid. Check TSH.   Hyperlipidemia - continue present medications.  Hyperkalemia: resolved.   Code Status: full code Family Communication: none at bedside Disposition Plan: pending.    Consultants: none Procedures:  Mri abdomen pending.   Antibiotics: none HPI/Subjective: Comfortable. Currently she does not want any work up done for the occult blood in the stool. She reports she is visiting from [place], and wishes to go back and follow up with her PCP there. Her diarrhea also has improved and she is on regular diet and tolerating well. Discussed the results of the CT, she reports she does not want any procedures for the renal mass.   Objective: Filed Vitals:   10/12/12 2254 10/13/12 0448 10/13/12 0920 10/13/12 1420  BP: 107/43 120/52 123/45 118/51  Pulse: 71 72 79 75  Temp: 98 F (36.7 C) 98.1 F (36.7 C) 98.1 F (36.7 C) 98 F (36.7 C)   TempSrc: Oral Oral Oral Oral  Resp: 18 18 20 18   Height: 4\' 10"  (1.473 m)     Weight: 62.5 kg (137 lb 12.6 oz)     SpO2: 98% 98% 97% 97%    Intake/Output Summary (Last 24 hours) at 10/13/12 1707 Last data filed at 10/13/12 1500  Gross per 24 hour  Intake 1045.83 ml  Output   1400 ml  Net -354.17 ml   Filed Weights   10/12/12 1306 10/12/12 2254  Weight: 63.05 kg (139 lb) 62.5 kg (137 lb 12.6 oz)    Exam:   General:  Alert afebrile comfortable  Cardiovascular: s1s2  Respiratory: CTAB  Abdomen: osoft NT ND BS+  Musculoskeletal: no pedal edema.   Data Reviewed: Basic Metabolic Panel:  Recent Labs Lab 10/12/12 0902 10/12/12 1400 10/12/12 2050 10/13/12 0530  NA 136 132* 137 138  K 6.9* 6.7* 5.2* 4.5  CL 111 103 109 106  CO2 18* 14* 17* 20  GLUCOSE 96 87 98 84  BUN 85* 87* 74* 63*  CREATININE 2.21* 2.10* 1.82* 1.69*  CALCIUM 8.8 9.1 8.8 8.1*   Liver Function Tests:  Recent Labs Lab 10/12/12 0902 10/12/12 1400 10/13/12 0530  AST 24 30 20   ALT 28 27 22   ALKPHOS 95 100 83  BILITOT 0.5 0.4 0.4  PROT 6.4 7.0 5.8*  ALBUMIN 4.1 3.9 3.1*   No results found for this basename: LIPASE, AMYLASE,  in the last 168 hours No results found for this basename: AMMONIA,  in the last 168 hours CBC:  Recent Labs Lab  10/12/12 1400 10/12/12 2050 10/13/12 0530  WBC 7.6 6.8 5.3  NEUTROABS 5.3  --  3.2  HGB 11.6* 11.1* 9.7*  HCT 34.6* 32.6* 28.2*  MCV 97.2 95.3 93.4  PLT 124* 124* 108*   Cardiac Enzymes: No results found for this basename: CKTOTAL, CKMB, CKMBINDEX, TROPONINI,  in the last 168 hours BNP (last 3 results) No results found for this basename: PROBNP,  in the last 8760 hours CBG: No results found for this basename: GLUCAP,  in the last 168 hours  Recent Results (from the past 240 hour(s))  URINE CULTURE     Status: None   Collection Time    10/12/12  9:15 AM      Result Value Range Status   Colony Count 40,000 COLONIES/ML   Preliminary    Preliminary Report ESCHERICHIA COLI   Preliminary  CLOSTRIDIUM DIFFICILE BY PCR     Status: None   Collection Time    10/12/12  3:27 PM      Result Value Range Status   C difficile by pcr NEGATIVE  NEGATIVE Final     Studies: Ct Abdomen Pelvis Wo Contrast  10/12/2012   *RADIOLOGY REPORT*  Clinical Data: Diarrhea and left lower quadrant pain  CT ABDOMEN AND PELVIS WITHOUT CONTRAST  Technique:  Multidetector CT imaging of the abdomen and pelvis was performed following the standard protocol without intravenous contrast.  Comparison: None.  Findings: Lung bases are well aerated without evidence of focal infiltrate or sizable effusion.  A few small tiny 2-3 mm nodular densities are noted within the bases bilaterally.  The liver is well visualized demonstrates some calcified granulomas.  Similar findings are noted within the spleen. Multiple small dependent gallstones are noted.  The adrenal glands are unremarkable.  A small hiatal hernia is seen.  The pancreas is within normal limits.  The kidneys are well visualized and reveal no evidence of renal calculi or obstructive changes.  There is a 4.6 cm cystic lesion within the left kidney although it demonstrates at least three areas of mural soft tissue density.  These changes are highly suspicious for a renal neoplasm.  Mild diverticular change is seen without diverticulitis.  The appendix is not visualized consistent with the clinical history. The bony structures show degenerative change of the lumbar spine.  IMPRESSION: Cystic mass lesion within the left kidney as described.  This is highly suspicious for renal neoplasm. The patient has an elevated creatinine precluding contrast administration.  An MRI of the kidneys may be of further assistance in clarifying the nature of this lesion.  Cholelithiasis.  Diverticulosis without diverticulitis.  Multiple tiny nodular densities within the chest. CT of the chest on any non emergent basis is recommended   Original  Report Authenticated By: Alcide Clever, M.D.    Scheduled Meds: . aspirin EC  81 mg Oral Daily  . isosorbide mononitrate  30 mg Oral Daily  . levothyroxine  25 mcg Oral QAC breakfast  . metoprolol succinate  25 mg Oral Daily  . simvastatin  20 mg Oral QPM  . sodium chloride  3 mL Intravenous Q12H   Continuous Infusions: .  sodium bicarbonate 150 mEq in sterile water 1000 mL infusion 50 mL/hr at 10/13/12 8119    Principal Problem:   Acute renal failure Active Problems:   Essential hypertension, benign   Hypothyroidism   Hyperlipidemia LDL goal <70   Metabolic acidosis   Hyperkalemia   Diarrhea    Time spent: 35 min   Daphine Loch  [facility] Pager [phone] If 7PM-7AM, please contact night-coverage at [email or web address], password [facility] 10/13/2012, 5:07 PM  LOS: 1 day

## 2012-10-13 NOTE — Progress Notes (Signed)
Nutrition Brief Note  Patient identified on the Malnutrition Screening Tool (MST) Report for recent weight loss without trying.  Reports this loss to be fluid related.  Body mass index is 28.81 kg/(m^2). Patient meets criteria for Overweight based on current BMI.   Current diet order is Heart Healthy, patient is consuming approximately 100% of meals at this time. Labs and medications reviewed.   No nutrition interventions warranted at this time. If nutrition issues arise, please consult RD.   Maureen Chatters, RD, LDN Pager #: [phone] After-Hours Pager #: [phone]

## 2012-10-13 NOTE — Progress Notes (Signed)
UR COMPLETED  

## 2012-10-14 DIAGNOSIS — D649 Anemia, unspecified: Secondary | ICD-10-CM

## 2012-10-14 LAB — BASIC METABOLIC PANEL
CO2: 21 mEq/L (ref 19–32)
Calcium: 8.2 mg/dL — ABNORMAL LOW (ref 8.4–10.5)
Creatinine, Ser: 1.56 mg/dL — ABNORMAL HIGH (ref 0.50–1.10)
GFR calc non Af Amer: 28 mL/min — ABNORMAL LOW (ref >90)
Glucose, Bld: 160 mg/dL — ABNORMAL HIGH (ref 70–99)
Sodium: 137 mEq/L (ref 135–145)

## 2012-10-14 LAB — HEMOGLOBIN AND HEMATOCRIT, BLOOD
HCT: 30.2 % — ABNORMAL LOW (ref 36.0–46.0)
Hemoglobin: 10.6 g/dL — ABNORMAL LOW (ref 12.0–15.0)

## 2012-10-14 MED ORDER — CIPROFLOXACIN HCL 500 MG PO TABS
500.0000 mg | ORAL_TABLET | ORAL | Status: DC
  Administered 2012-10-14: 500 mg via ORAL
  Filled 2012-10-14: qty 1

## 2012-10-14 NOTE — Progress Notes (Signed)
Pt. Got d/C orders and follow up appointments.med's were call to pharmacy by MD.IV was d/c,tele was d/c.pt. Ready to go home with her son.

## 2012-10-20 ENCOUNTER — Encounter: Payer: Self-pay | Admitting: Family Medicine

## 2012-10-20 ENCOUNTER — Telehealth: Payer: Self-pay | Admitting: Family Medicine

## 2012-10-20 DIAGNOSIS — M51369 Other intervertebral disc degeneration, lumbar region without mention of lumbar back pain or lower extremity pain: Secondary | ICD-10-CM | POA: Insufficient documentation

## 2012-10-20 DIAGNOSIS — D649 Anemia, unspecified: Secondary | ICD-10-CM | POA: Insufficient documentation

## 2012-10-20 DIAGNOSIS — N2889 Other specified disorders of kidney and ureter: Secondary | ICD-10-CM | POA: Insufficient documentation

## 2012-10-20 DIAGNOSIS — M5136 Other intervertebral disc degeneration, lumbar region: Secondary | ICD-10-CM | POA: Insufficient documentation

## 2012-10-20 NOTE — Telephone Encounter (Signed)
[facility] was [facility] and admitted May 31 it appears she might still be an inpatient. Admitted for shortness of breath, back pain, recent diagnosis of renal lesion concerning for renal cancer, and concern for pulmonary embolism saturating 97% room air with a VQ scan showing small perfusion mismatch defect in left lung apex indeterminate for acute pulmonary embolus unable to perform CT chest with contrast.  Lumbar MRI showing degenerative disc disease all of her no central stenosis mild lateral foraminal encroachment L5-S1.  Thoracic spine MRI bulging lower thoracic discs without stenosis. Lower cervical spondylosis with possible central stenosis.  At this time it does not appear she has referral for further workup of renal mass. However,spoke with Son, [name], just now who tells me they have an appt. With Dr. Berneice Heinrich at [facility] urology on the 16th for further evaluation of her renal mass.

## 2012-10-22 ENCOUNTER — Ambulatory Visit: Payer: Medicare Other | Admitting: Family Medicine

## 2012-10-26 ENCOUNTER — Encounter: Payer: Self-pay | Admitting: Family Medicine

## 2012-10-26 DIAGNOSIS — I351 Nonrheumatic aortic (valve) insufficiency: Secondary | ICD-10-CM | POA: Insufficient documentation

## 2012-10-26 NOTE — Discharge Summary (Signed)
Physician Discharge Summary  Natalie Hogan OZH:[id number] DOB: 1918-03-07 DOA: 10/12/2012  PCP: Laren Boom, DO  Admit date: 10/12/2012 Discharge date: 10/14/2012  Time spent: 35 minutes  Recommendations for Outpatient Follow-up:  1. Follow up with PCP/ Urology/GI as recommended.   Discharge Diagnoses:  Principal Problem:   Acute renal failure Active Problems:   Essential hypertension, benign   Hypothyroidism   Hyperlipidemia LDL goal <70   Metabolic acidosis   Hyperkalemia   Diarrhea   Discharge Condition: improved.   Diet recommendation: low sodium diet  Filed Weights   10/12/12 1306 10/12/12 2254 10/13/12 2036  Weight: 63.05 kg (139 lb) 62.5 kg (137 lb 12.6 oz) 64.003 kg (141 lb 1.6 oz)    History of present illness:  Natalie Hogan is a 79 y.o. female no history of hypertension, hypothyroidism, hyperlipidemia presented to the ER at [facility] with complaints of persistent diarrhea. Patient has been having multiple episodes of diarrhea for last 2 weeks. Denies any nausea vomiting fever chills. Denies having used any recent antibiotics. Patient has been hospitalized recently 2 months ago at [facility] for dizziness. Patient has been experiencing over the last 2 weeks left-sided dull pain. In the ER patient was found to have metabolic acidosis with hyperkalemia and renal failure. CT abdomen pelvis shows cholelithiasis and left renal mass. Patient has been admitted for further management. Patient was given Kayexalate and was started on bicarbonate drip. EKG did not show any acute changes. Patient denies any shortness of breath chest pain. She was admitted to hospitalist service for further management.   Hospital Course:  1.A1.  Acute renal failure with metabolic acidosis - probably precipitated by diarrhea and patient being on Cozaar and Lasix has worsened it.  We have stopped Cozaar and  Lasix and started her on bicarbonate drip and IV fluids. . CT  Of the abdomen does not  show any hydronephrosis but showed cystic mass lesion within the left kidney. Patient had already taken Kayexalate in the ER for hyperkalemia. There is no EKG changes. UA is negative for any infection. Her creatinine has improved to 1.56.  Her abnormal CT abdomen was followed up with MRI of the abdomen which showed complex multi septated left renal lesion, consistent with a Bosniak type 3 lesion. Urology was called and recommended outpatient follow up with Dr Berneice Heinrich for the abnormal MRI of the abdomen.    2. Diarrhea - probably gastroenteritis. Stool studies including C. difficile and cultures have been ordered. Stool for occult blood was positive and cdiff pcr negative. She never had a colonoscopy before and she is currently refusing to get a colonoscopy or even a sigmoidoscopy in the hospital. But spoke to her son, who said, he will talk to her , currently recommend outpatient work up with GI. Her H&H has been stable so far.  3. Left renal mass - MRI abdomen without contrast has been ordered for further study and outpatient follow up .  4. Mild thrombocytopenia - outpatient follow up. Hypertension - holding off Cozaar Lasix and patient has been placed on when necessary hydralazine for systolic blood pressure more than 160  Hypothyroidism - continue Synthroid. Check TSH.  Hyperlipidemia - continue present medications.  Hyperkalemia: resolved.    Procedures: MRI abdomen.:1. Moderate degradation, as detailed above. 2. Complex multi septated left renal lesion. 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 Considerations include cystic renal cell carcinoma or multilocular cystic nephroma. 3. Cholelithiasis. 4. No evidence of metastatic disease within the abdomen.  Consultations:  none  Discharge Exam: Filed Vitals:   10/13/12 2036 10/14/12 0524 10/14/12 0914 10/14/12 1338  BP: 101/38 117/44 108/55 137/66  Pulse: 72 71 81 86  Temp: 98 F (36.7 C) 97.9 F (36.6 C) 97.7 F (36.5  C) 97.8 F (36.6 C)  TempSrc: Oral Oral Oral Oral  Resp: 18 18 18 18   Height:      Weight: 64.003 kg (141 lb 1.6 oz)     SpO2: 98% 98% 98% 100%   General: Alert afebrile comfortable  Cardiovascular: s1s2  Respiratory: CTAB  Abdomen: soft NT ND BS+  Musculoskeletal: no pedal edema.     Discharge Instructions  Discharge Orders   Future Orders Complete By Expires     Diet - low sodium heart healthy  As directed     Discharge instructions  As directed     Comments:      Follow up with Dr Berneice Heinrich in one week.  Follow up with [facility] in one week Follow up withPCP in one week.        Medication List    STOP taking these medications       celecoxib 200 MG capsule  Commonly known as:  CELEBREX     colchicine 0.6 MG tablet     furosemide 20 MG tablet  Commonly known as:  LASIX     valsartan 160 MG tablet  Commonly known as:  DIOVAN      TAKE these medications       aspirin 81 MG tablet  Take 81 mg by mouth daily.     ciprofloxacin 500 MG tablet  Commonly known as:  CIPRO  Take 1 tablet (500 mg total) by mouth 2 (two) times daily.     isosorbide mononitrate 30 MG 24 hr tablet  Commonly known as:  IMDUR  Take 30 mg by mouth daily.     levothyroxine 25 MCG tablet  Commonly known as:  SYNTHROID, LEVOTHROID  Take 25 mcg by mouth daily before breakfast.     metoprolol succinate 25 MG 24 hr tablet  Commonly known as:  TOPROL-XL  Take 25 mg by mouth daily.     nitroGLYCERIN 0.4 MG/SPRAY spray  Commonly known as:  NITROLINGUAL  Place 1 spray under the tongue every 5 (five) minutes as needed for chest pain.     simvastatin 20 MG tablet  Commonly known as:  ZOCOR  Take 20 mg by mouth every evening.       No Known Allergies     Follow-up Information   Follow up with Sebastian Ache, MD. Schedule an appointment as soon as possible for a visit in 1 week.   Contact information:   [address] [place] [address] [phone]       Follow up  with Theda Belfast, MD. Schedule an appointment as soon as possible for a visit in 1 week.   Contact information:   [address] [place] [address] [phone]       Follow up with Laren Boom, DO. Schedule an appointment as soon as possible for a visit in 1 week. (check BMP in one week. )    Contact information:   [address] [place] [address] [phone]        The results of significant diagnostics from this hospitalization (including imaging, microbiology, ancillary and laboratory) are listed below for reference.    Significant Diagnostic Studies: Ct Abdomen Pelvis Wo Contrast  10/12/2012   *  RADIOLOGY REPORT*  Clinical Data: Diarrhea and left lower quadrant pain  CT ABDOMEN AND PELVIS WITHOUT CONTRAST  Technique:  Multidetector CT imaging of the abdomen and pelvis was performed following the standard protocol without intravenous contrast.  Comparison: None.  Findings: Lung bases are well aerated without evidence of focal infiltrate or sizable effusion.  A few small tiny 2-3 mm nodular densities are noted within the bases bilaterally.  The liver is well visualized demonstrates some calcified granulomas.  Similar findings are noted within the spleen. Multiple small dependent gallstones are noted.  The adrenal glands are unremarkable.  A small hiatal hernia is seen.  The pancreas is within normal limits.  The kidneys are well visualized and reveal no evidence of renal calculi or obstructive changes.  There is a 4.6 cm cystic lesion within the left kidney although it demonstrates at least three areas of mural soft tissue density.  These changes are highly suspicious for a renal neoplasm.  Mild diverticular change is seen without diverticulitis.  The appendix is not visualized consistent with the clinical history. The bony structures show degenerative change of the lumbar spine.  IMPRESSION: Cystic mass lesion within the left kidney as described.  This is highly  suspicious for renal neoplasm. The patient has an elevated creatinine precluding contrast administration.  An MRI of the kidneys may be of further assistance in clarifying the nature of this lesion.  Cholelithiasis.  Diverticulosis without diverticulitis.  Multiple tiny nodular densities within the chest. CT of the chest on any non emergent basis is recommended   Original Report Authenticated By: Alcide Clever, M.D.   Mr Abdomen Wo Contrast  10/14/2012   *RADIOLOGY REPORT*  Clinical Data: Left renal mass on CT.  MRI ABDOMEN WITHOUT CONTRAST  Technique:  Multiplanar multisequence MR imaging of the abdomen was performed. No intravenous contrast was administered.  Comparison: CT 10/12/2012  Findings: Moderate degradation secondary to motion and lack of IV contrast.  Mild cardiomegaly.  Small hiatal hernia.  Normal liver, spleen, distal stomach, pancreas.  Cholelithiasis, better visualized on recent CT.  No evidence of acute cholecystitis or biliary ductal dilatation.  Normal adrenal glands.  Bilateral renal atrophy.  Small left renal lesions which are likely cysts.  Corresponding to the CT abnormality, within the upper pole left kidney, is a multi septated complex cystic lesion.  This measures 4.4 x 4.0 cm transverse on image 18/series 4 and 4.9 cm cranial caudal on coronal image 16/series 6.  Demonstrates multiple areas of complexity as evidenced by T2 hypointensity.  Example area of complexity measures 1.3 cm medially on image 18/series 4.  Other areas are seen anteriorly on image 20/series 4 and laterally on image 22/series 5.  There is suggestion of herniation of this lesion into the renal pelvis on image 14/series 6 ( typical of but not specific for multilocular cystic nephroma).  Left renal vein normal in caliber and grossly patent.  No retroperitoneal adenopathy.  No ascites.  IMPRESSION:  1.  Moderate degradation, as detailed above. 2.  Complex multi septated left renal lesion.  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  Considerations include cystic renal cell carcinoma or multilocular cystic nephroma. 3.  Cholelithiasis. 4.  No evidence of metastatic disease within the abdomen.   Original Report Authenticated By: Jeronimo Greaves, M.D.    Microbiology: No results found for this or any previous visit (from the past 240 hour(s)).   Labs: Basic Metabolic Panel: No results found for this basename: NA,  K, CL, CO2, GLUCOSE, BUN, CREATININE, CALCIUM, MG, PHOS,  in the last 168 hours Liver Function Tests: No results found for this basename: AST, ALT, ALKPHOS, BILITOT, PROT, ALBUMIN,  in the last 168 hours No results found for this basename: LIPASE, AMYLASE,  in the last 168 hours No results found for this basename: AMMONIA,  in the last 168 hours CBC: No results found for this basename: WBC, NEUTROABS, HGB, HCT, MCV, PLT,  in the last 168 hours Cardiac Enzymes: No results found for this basename: CKTOTAL, CKMB, CKMBINDEX, TROPONINI,  in the last 168 hours BNP: BNP (last 3 results) No results found for this basename: PROBNP,  in the last 8760 hours CBG: No results found for this basename: GLUCAP,  in the last 168 hours     Signed:  Brach Birdsall  [facility] 10/14/2012, 12:17 PM

## 2012-11-03 ENCOUNTER — Encounter: Payer: Self-pay | Admitting: Family Medicine

## 2012-11-15 ENCOUNTER — Ambulatory Visit (INDEPENDENT_AMBULATORY_CARE_PROVIDER_SITE_OTHER): Payer: Medicare Other | Admitting: Family Medicine

## 2012-11-15 ENCOUNTER — Encounter: Payer: Self-pay | Admitting: Family Medicine

## 2012-11-15 VITALS — BP 156/72 | HR 101 | Temp 98.0°F | Wt 135.0 lb

## 2012-11-15 DIAGNOSIS — N289 Disorder of kidney and ureter, unspecified: Secondary | ICD-10-CM

## 2012-11-15 DIAGNOSIS — M5137 Other intervertebral disc degeneration, lumbosacral region: Secondary | ICD-10-CM

## 2012-11-15 DIAGNOSIS — N179 Acute kidney failure, unspecified: Secondary | ICD-10-CM

## 2012-11-15 DIAGNOSIS — I2699 Other pulmonary embolism without acute cor pulmonale: Secondary | ICD-10-CM | POA: Insufficient documentation

## 2012-11-15 DIAGNOSIS — I10 Essential (primary) hypertension: Secondary | ICD-10-CM

## 2012-11-15 DIAGNOSIS — R05 Cough: Secondary | ICD-10-CM

## 2012-11-15 DIAGNOSIS — N2889 Other specified disorders of kidney and ureter: Secondary | ICD-10-CM

## 2012-11-15 DIAGNOSIS — M5136 Other intervertebral disc degeneration, lumbar region: Secondary | ICD-10-CM

## 2012-11-15 LAB — BASIC METABOLIC PANEL WITH GFR
BUN: 21 mg/dL (ref 6–23)
CO2: 23 mEq/L (ref 19–32)
Calcium: 8.7 mg/dL (ref 8.4–10.5)
Chloride: 103 mEq/L (ref 96–112)
Creat: 1.16 mg/dL — ABNORMAL HIGH (ref 0.50–1.10)
Glucose, Bld: 104 mg/dL — ABNORMAL HIGH (ref 70–99)

## 2012-11-15 MED ORDER — AZITHROMYCIN 250 MG PO TABS: ORAL_TABLET | ORAL | Status: AC

## 2012-11-15 MED ORDER — GUAIFENESIN 100 MG/5ML PO SYRP: 400.0000 mg | ORAL_SOLUTION | Freq: Four times a day (QID) | ORAL | Status: DC | PRN

## 2012-11-15 MED ORDER — GABAPENTIN 100 MG PO CAPS: 100.0000 mg | ORAL_CAPSULE | Freq: Three times a day (TID) | ORAL | Status: DC

## 2012-11-15 NOTE — Progress Notes (Signed)
CC: Natalie Hogan is a 79 y.o. female is here for f/u Rehab Discharge   Subjective: HPI:  Patient presents for followup after discharge from rehabilitation center this last Friday.  During her admission there was suspicion for acute pulmonary and wasn't due to a abnormal VQ scan. The family tells me she was on what I believe was probably Lovenox for approximately 5 days and then this was stopped intentionally after her attending physician felt it was very unlikely that she truly had a pulmonary embolism. She tells me she did not require oxygen during her hospital stay. She denies worsening shortness of breath since discharge or during her stay at the rehabilitation center. She feels she is progressing from a cardiopulmonary standpoint with her rehabilitation which will be continued at home twice a week.  During her admission a mass was found in her left kidney. She has seen a nephrologist Dr. Berneice Hogan and a joint decision was made to not biopsy this lesion as her nephrologist felt comfortable enough to counsel her on only following up annually as is its expected  to grow "less than a millimeter year" and will unlikely cause complications. She denies abdominal pain, nor left flank pain nor blood in urine.    she was initially admitted for diarrhea and acute renal failure with a BUN to creatinine ratio of 39:1.5.  It appears this corrected with IV fluid rehydration.  Patient denies any diarrhea over the past week, she is having well formed bowel movements predictably on a daily basis. She was taking a loop diuretic at her nursing home but this was discontinued prior to her discharge. She has not had her kidney function checked last 2 weeks. She reports mild lower extremity edema which is significantly improved wearing compression stockings on a daily basis  The day after discharge from her first admission at [facility] she had 10 out of 10 lower extremity pain with swelling and redness and warmth of the  left ankle it was ultimately felt to be a gout flare according to her attending physician team. Since discharge she denies any joint or muscular pain. She was started on daily colchicine. Due to concerns of lumbar radiculopathy she was started on gabapentin and has been tolerating this.  Patient complains of a cough ever since Wednesday of last week after spending a little over a week at her rehabilitation center. It is nonproductive. It will bother her one to 2 minutes every one to 2 hours. Described as mild in severity she has some shortness of breath while coughing but not in between. She denies fevers, chills, chest pain, orthopnea, confusion.   Review Of Systems Outlined In HPI  Past Medical History  Diagnosis Date  . Heart disease   . Hypertension   . Family history of thyroid problem   . Hypothyroidism   . Hyperlipidemia   . Peripheral vascular disease   . Anginal pain   . Exertional shortness of breath     "lately" (10/12/2012)  . Arthritis     "little bit in my knees" (10/12/2012)  . Gout   . Coronary artery disease   . Diverticulitis      Family History  Problem Relation Age of Onset  . Hypertension Son      History  Substance Use Topics  . Smoking status: Former Smoker -- 10 years    Types: Cigarettes    Quit date: 05/20/1963  . Smokeless tobacco: Never Used  . Alcohol Use: No  Objective: Filed Vitals:   11/15/12 0953  BP: 156/72  Pulse: 101  Temp: 98 F (36.7 C)    General: Alert and Oriented, No Acute Distress HEENT: Pupils equal, round, reactive to light. Conjunctivae clear.  External ears unremarkable, canals clear with intact TMs with appropriate landmarks.  Middle ear appears open without effusion. Pink inferior turbinates.  Moist mucous membranes, pharynx without inflammation nor lesions.  Neck supple without palpable lymphadenopathy nor abnormal masses. Lungs: Clear to auscultation bilaterally, no wheezing/ronchi/rales.  Comfortable work of  breathing. Good air movement. Cardiac: Regular rate and rhythm. Normal S1/S2.  No murmurs, rubs, nor gallops.   Abdomen:  soft nontender to palpation  Extremities:  1+ nonpitting edema bilaterally symmetric from the ankles to shins .  Strong peripheral pulses.  Mental Status: No depression, anxiety, nor agitation. Skin: Warm and dry.  Assessment & Plan: Natalie Hogan was seen today for f/u rehab discharge.  Diagnoses and associated orders for this visit:  Acute pulmonary embolism June 2014  Essential hypertension, benign - BASIC METABOLIC PANEL WITH GFR  Left renal mass  Acute renal failure - BASIC METABOLIC PANEL WITH GFR  Degenerative disc disease, lumbar - gabapentin (NEURONTIN) 100 MG capsule; Take 1 capsule (100 mg total) by mouth 3 (three) times daily.  Cough - guaifenesin (ROBITUSSIN) 100 MG/5ML syrup; Take 20 mLs (400 mg total) by mouth 4 (four) times daily as needed for cough. - azithromycin (ZITHROMAX) 250 MG tablet; Take two tabs at once on day 1, then one tab daily on days 2-5.  Other Orders - colchicine 0.6 MG tablet; Take 1 tablet (0.6 mg total) by mouth daily. - isosorbide mononitrate (IMDUR) 30 MG 24 hr tablet; Take 1 tablet (30 mg total) by mouth daily.    Acute pulmonary embolism: Family is adamant that they had a conversation with her attending physician and was a joint decision to stop anticoagulation. I've asked the patient and family to keep a very close eye on her breathing status and if she has any worsening shortness of breath, racing heart, or chest pain to notify me immediately and that this may be a sign that she truly does have a pulmonary embolism. Fortunately she is progressing well with her physical therapy from a pulmonary standpoint which lowers my suspicion for pulmonary embolism. Essential hypertension: Uncontrolled, she has run out of her Imdur and she will restart today. Left renal mass: Will defer to specialist and annual followup Degenerative disc  disease: Stable, continue gabapentin Acute renal failure: She is due for a basic metabolic panel which were obtained today Cough: Suspect bacterial bronchitis start Augmentin and as needed guaifenesin  40 minutes spent face-to-face during visit today of which at least 50% was counseling or coordinating care regarding acute renal failure, acute pulmonary embolism, essential hypertension, left renal mass, degenerative disc disease, acute renal failure, cough   Return in about 4 weeks (around 12/13/2012).

## 2012-12-08 ENCOUNTER — Encounter: Payer: Self-pay | Admitting: *Deleted

## 2012-12-10 ENCOUNTER — Encounter: Payer: Self-pay | Admitting: Family Medicine

## 2012-12-10 ENCOUNTER — Ambulatory Visit (INDEPENDENT_AMBULATORY_CARE_PROVIDER_SITE_OTHER): Payer: Medicare Other | Admitting: Family Medicine

## 2012-12-10 VITALS — BP 159/80 | HR 81 | Wt 134.0 lb

## 2012-12-10 DIAGNOSIS — R609 Edema, unspecified: Secondary | ICD-10-CM

## 2012-12-10 DIAGNOSIS — D649 Anemia, unspecified: Secondary | ICD-10-CM

## 2012-12-10 DIAGNOSIS — E785 Hyperlipidemia, unspecified: Secondary | ICD-10-CM

## 2012-12-10 DIAGNOSIS — I10 Essential (primary) hypertension: Secondary | ICD-10-CM

## 2012-12-10 DIAGNOSIS — R600 Localized edema: Secondary | ICD-10-CM | POA: Insufficient documentation

## 2012-12-10 NOTE — Progress Notes (Signed)
CC: Natalie Hogan is a 79 y.o. female is here for Follow-up   Subjective: HPI:  Followup anemia: Patient reports her shortness of breath since discharge has improved. She gets mildly short of breath she walks more than supermarket asile length however this length is increasing a weekly basis. She denies any chest pain. She is not taking any iron supplementation or extra vitamins. She denies any easy bruisability or bleeding issues.   Followup essential hypertension: She confirms she is taking imdur and metoprolol she believes her metoprolol is the immediate release formulation and she is only taking this once a day. No outside blood pressure is report. Denies motor or sensory disturbances.  Hyperlipidemia: She tries to stay active on a daily basis she is not taking any cholesterol medication last cholesterol panel was over a year ago.  Followup lower extremity edema: She used to use Lasix however now is only using compression stockings on a daily basis. She reports swelling is mild if she does not use compression stockings is absent when using stockings. She denies weeping in the lower extremities nor any leg pain.  Review Of Systems Outlined In HPI  Past Medical History  Diagnosis Date  . Heart disease   . Hypertension   . Family history of thyroid problem   . Hypothyroidism   . Hyperlipidemia   . Peripheral vascular disease   . Anginal pain   . Exertional shortness of breath     "lately" (10/12/2012)  . Arthritis     "little bit in my knees" (10/12/2012)  . Gout   . Coronary artery disease   . Diverticulitis      Family History  Problem Relation Age of Onset  . Hypertension Son      History  Substance Use Topics  . Smoking status: Former Smoker -- 10 years    Types: Cigarettes    Quit date: 05/20/1963  . Smokeless tobacco: Never Used  . Alcohol Use: No     Objective: Filed Vitals:   12/10/12 0959  BP: 159/80  Pulse: 81    General: Alert and Oriented, No Acute  Distress HEENT: Pupils equal, round, reactive to light. Conjunctivae clear.  Moist mucous membranes pharynx unremarkable Lungs: Clear to auscultation bilaterally, no wheezing/ronchi/rales.  Comfortable work of breathing. Good air movement. Cardiac: Regular rate and rhythm. Normal S1/S2.  No murmurs, rubs, nor gallops.   Extremities: No peripheral edema.  Strong peripheral pulses.  Mental Status: No depression, anxiety, nor agitation. Skin: Warm and dry.  Assessment & Plan: Natalie Hogan was seen today for follow-up.  Diagnoses and associated orders for this visit:  Anemia - CBC  Hyperlipidemia - Lipid panel  Essential hypertension, benign  Hyperlipidemia LDL goal <70  Peripheral edema    Anemia: Clinically stable due for hemoglobin given persistent shortness of breath Hyperlipidemia: She's due for fasting lipid panel she will obtain this next week Essential hypertension: Uncontrolled they will call me with specifics about metoprolol extended release versus immediate release so we can adjust this over the phone Peripheral edema: Controlled continue daily compression stockings  Return in about 3 months (around 03/12/2013).

## 2013-01-06 ENCOUNTER — Encounter: Payer: Self-pay | Admitting: Family Medicine

## 2013-01-06 ENCOUNTER — Ambulatory Visit (INDEPENDENT_AMBULATORY_CARE_PROVIDER_SITE_OTHER): Payer: Medicare Other | Admitting: Family Medicine

## 2013-01-06 VITALS — BP 157/69 | HR 67 | Wt 135.0 lb

## 2013-01-06 DIAGNOSIS — R609 Edema, unspecified: Secondary | ICD-10-CM | POA: Insufficient documentation

## 2013-01-06 DIAGNOSIS — T148XXA Other injury of unspecified body region, initial encounter: Secondary | ICD-10-CM

## 2013-01-06 DIAGNOSIS — D649 Anemia, unspecified: Secondary | ICD-10-CM

## 2013-01-06 DIAGNOSIS — Z23 Encounter for immunization: Secondary | ICD-10-CM

## 2013-01-06 DIAGNOSIS — M109 Gout, unspecified: Secondary | ICD-10-CM | POA: Insufficient documentation

## 2013-01-06 DIAGNOSIS — I10 Essential (primary) hypertension: Secondary | ICD-10-CM

## 2013-01-06 NOTE — Progress Notes (Signed)
CC: Natalie Hogan is a 79 y.o. female is here for Gout and Callouses   Subjective: HPI:  Accompanied by son Natalie Hogan  Patient complains of right foot pain localized on the lateral small toe has been present for 3 days. It is painful described only as sharp mild to moderate pain only present when wearing tightfitting shoes his abdomen wearing slippers or barefoot. Is described as red and swollen. No interventions as of yet. She's concerned that it is gout..   Patient complains of bilateral lower extremity edema is present daily for the past month seems to gotten worse since discharge from the hospital last month. She describes a symmetrical it is somewhat improved in the morning or with legs elevated is at its worst in the afternoon and evening. It is slightly improved with compression stockings but not completely relieved. She denies history of DVTs or blood clots. Denies shortness of breath rapid heartbeat or chest pain. She tries to stick to a low salt diet.. it appears that she was on f Lasix before going to the hospital one month ago. She denies swelling of her extremities nor orthopnea  Patient complains of an abrasion on her right nose that has been present for 3 days it is itchy, it occurred on the site of her eyeglass supports, she has modified her supports it is no longer touching this location. Cortisone applied to this morning no other interventions does not to seem to be getting better or worse since onset. She's concerned it may be skin cancer  Essential hypertension: She confirms that she is taking metoprolol tartrate but has been taking twice a day now. No outside blood pressures to report denies motor sensory disturbances   Review Of Systems Outlined In HPI  Past Medical History  Diagnosis Date  . Heart disease   . Hypertension   . Family history of thyroid problem   . Hypothyroidism   . Hyperlipidemia   . Peripheral vascular disease   . Anginal pain   . Exertional  shortness of breath     "lately" (10/12/2012)  . Arthritis     "little bit in my knees" (10/12/2012)  . Gout   . Coronary artery disease   . Diverticulitis      Family History  Problem Relation Age of Onset  . Hypertension Son      History  Substance Use Topics  . Smoking status: Former Smoker -- 10 years    Types: Cigarettes    Quit date: 05/20/1963  . Smokeless tobacco: Never Used  . Alcohol Use: No     Objective: Filed Vitals:   01/06/13 0931  BP: 157/69  Pulse: 67    General: Alert and Oriented, No Acute Distress HEENT: Pupils equal, round, reactive to light. Conjunctivae clear.  On the right bridge of the nose there is a half centimeter by quarter centimeter shallow abrasion with mild erythema no signs of infection. Moist mucous membranes, pharynx without inflammation nor lesions.   Lungs: Clear to auscultation bilaterally, no wheezing/ronchi/rales.  Comfortable work of breathing. Good air movement. Cardiac: Regular rate and rhythm. Normal S1/S2.  No murmurs, rubs, nor gallops.   Extremities: 1+ pitting edema symmetrically from the toes to the mid shins without overlying skin changes. Strong peripheral pulses. The lateral surface of her right fifth toe is slightly erythematous slightly tender there is no swelling it is not painful to light touch Mental Status: No depression, anxiety, nor agitation. Skin: Warm and dry.  Assessment & Plan: Yasmin was  seen today for gout and callouses.  Diagnoses and associated orders for this visit:  Anemia  Essential hypertension, benign  Edema  Need for prophylactic vaccination and inoculation against influenza  Skin abrasion    Anemia: Clinically this sounds stable given her lack of shortness of breath today she has not obtained her hemoglobin check yet I encouraged her to have this done in the near future Essential hypertension: Uncontrolled, we will avoid adjusting medications until we see how she responds to Lasix below  as I expect at least some benefit to blood pressure Edema: Is most likely due to her stopping furosemide after discharge, she will restart her regimen as I believe she was in acute kidney failure due to her diarrhea when she went to the hospital not necessarily solely due to Lasix skin abrasion: I suspect this is due to friction of her eyeglass support, encouraged him to use Vaseline applied to the spot twice a day if no improvement in one week return for biopsy or cryotherapy  She received flu shot today  Return in about 4 weeks (around 02/03/2013).

## 2013-01-18 ENCOUNTER — Encounter: Payer: Self-pay | Admitting: Sports Medicine

## 2013-01-18 ENCOUNTER — Ambulatory Visit (INDEPENDENT_AMBULATORY_CARE_PROVIDER_SITE_OTHER): Payer: Medicare Other

## 2013-01-18 ENCOUNTER — Ambulatory Visit (INDEPENDENT_AMBULATORY_CARE_PROVIDER_SITE_OTHER): Payer: Medicare Other | Admitting: Sports Medicine

## 2013-01-18 VITALS — BP 170/78 | HR 67 | Wt 136.0 lb

## 2013-01-18 DIAGNOSIS — M25519 Pain in unspecified shoulder: Secondary | ICD-10-CM

## 2013-01-18 DIAGNOSIS — M25511 Pain in right shoulder: Secondary | ICD-10-CM | POA: Insufficient documentation

## 2013-01-18 DIAGNOSIS — W208XXA Other cause of strike by thrown, projected or falling object, initial encounter: Secondary | ICD-10-CM

## 2013-01-18 MED ORDER — MELOXICAM 15 MG PO TABS: ORAL_TABLET | ORAL | Status: DC

## 2013-01-18 NOTE — Assessment & Plan Note (Signed)
There is a combination of glenohumeral DJD and rotator cuff dysfunction. We will start conservatively with Mobic, formal physical therapy, x-rays. Like to see her back in one month, if no better I will probably perform a glenohumeral joint injection.

## 2013-01-18 NOTE — Progress Notes (Signed)
   Subjective:    I'm seeing this patient as a consultation for:  Dr. Ivan Anchors  CC: Right shoulder pain  HPI: This is a very pleasant 79 year old female who comes in with a several year history of pain that she localizes over the deltoid of her right shoulder, has been worse for the past few weeks. She has pain particularly with attempted overhead activities, no trauma, no pain in her neck, no radiation down to the fingertips. Pain is moderate, persistent.  Past medical history, Surgical history, Family history not pertinant except as noted below, Social history, Allergies, and medications have been entered into the medical record, reviewed, and no changes needed.   Review of Systems: No headache, visual changes, nausea, vomiting, diarrhea, constipation, dizziness, abdominal pain, skin rash, fevers, chills, night sweats, weight loss, swollen lymph nodes, body aches, joint swelling, muscle aches, chest pain, shortness of breath, mood changes, visual or auditory hallucinations.   Objective:   General: Well Developed, well nourished, and in no acute distress.  Neuro/Psych: Alert and oriented x3, extra-ocular muscles intact, able to move all 4 extremities, sensation grossly intact. Skin: Warm and dry, no rashes noted.  Respiratory: Not using accessory muscles, speaking in full sentences, trachea midline.  Cardiovascular: Pulses palpable, no extremity edema. Abdomen: Does not appear distended. Right Shoulder: Inspection reveals no abnormalities, atrophy or asymmetry. Palpation is normal with no tenderness over AC joint or bicipital groove. Palpable crepitus as the shoulder is taken through the range of motion. ROM is full in all planes. Rotator cuff strength weak to abduction. Positive Neer's, Hawkins, and Empty Can signs. Speeds and Yergason's tests normal. No labral pathology noted with negative Obrien's, negative clunk and good stability. Normal scapular function observed. No painful arc  and no drop arm sign. No apprehension sign  X-rays were reviewed and show arthritis in the glenohumeral as well as acromioclavicular joints. Impression and Recommendations:   This case required medical decision making of moderate complexity.

## 2013-01-26 ENCOUNTER — Ambulatory Visit: Payer: Medicare Other | Admitting: Physical Therapy

## 2013-01-26 DIAGNOSIS — M6281 Muscle weakness (generalized): Secondary | ICD-10-CM

## 2013-01-26 DIAGNOSIS — R293 Abnormal posture: Secondary | ICD-10-CM

## 2013-01-26 DIAGNOSIS — M25519 Pain in unspecified shoulder: Secondary | ICD-10-CM

## 2013-01-28 ENCOUNTER — Encounter: Payer: Medicare Other | Admitting: Physical Therapy

## 2013-01-28 DIAGNOSIS — R293 Abnormal posture: Secondary | ICD-10-CM

## 2013-01-28 DIAGNOSIS — M25519 Pain in unspecified shoulder: Secondary | ICD-10-CM

## 2013-01-28 DIAGNOSIS — M6281 Muscle weakness (generalized): Secondary | ICD-10-CM

## 2013-01-31 ENCOUNTER — Encounter: Payer: Medicare Other | Admitting: Physical Therapy

## 2013-01-31 DIAGNOSIS — R293 Abnormal posture: Secondary | ICD-10-CM

## 2013-01-31 DIAGNOSIS — M25519 Pain in unspecified shoulder: Secondary | ICD-10-CM

## 2013-01-31 DIAGNOSIS — M6281 Muscle weakness (generalized): Secondary | ICD-10-CM

## 2013-02-03 ENCOUNTER — Encounter: Payer: Medicare Other | Admitting: Physical Therapy

## 2013-02-03 DIAGNOSIS — M6281 Muscle weakness (generalized): Secondary | ICD-10-CM

## 2013-02-03 DIAGNOSIS — R293 Abnormal posture: Secondary | ICD-10-CM

## 2013-02-03 DIAGNOSIS — M25519 Pain in unspecified shoulder: Secondary | ICD-10-CM

## 2013-02-07 ENCOUNTER — Encounter: Payer: Medicare Other | Admitting: Physical Therapy

## 2013-02-16 ENCOUNTER — Other Ambulatory Visit: Payer: Self-pay | Admitting: *Deleted

## 2013-02-16 ENCOUNTER — Telehealth: Payer: Self-pay | Admitting: Family Medicine

## 2013-02-16 NOTE — Telephone Encounter (Signed)
Patient's son walk-in request to have colchicine, synthroid, gabapentin, metoprolol and isosorbide all her meds faxed in to the [facility] (Fax) [phone] 90 day supplies. Thanks

## 2013-02-18 ENCOUNTER — Other Ambulatory Visit: Payer: Self-pay | Admitting: *Deleted

## 2013-02-18 DIAGNOSIS — M5136 Other intervertebral disc degeneration, lumbar region: Secondary | ICD-10-CM

## 2013-02-18 NOTE — Progress Notes (Signed)
Pt's son request synthroid, metoprolol,Isosorbide and gabapenten, and colchicine sent to [facility] for 90 day supply. As Im putting in the orders I dont see that we have ordered synthroid or metoprolol for her before. Is it ok?

## 2013-02-21 MED ORDER — GABAPENTIN 100 MG PO CAPS: 100.0000 mg | ORAL_CAPSULE | Freq: Three times a day (TID) | ORAL | Status: DC

## 2013-02-21 MED ORDER — ISOSORBIDE MONONITRATE ER 30 MG PO TB24: 30.0000 mg | ORAL_TABLET | Freq: Every day | ORAL | Status: DC

## 2013-02-22 ENCOUNTER — Telehealth: Payer: Self-pay | Admitting: Family Medicine

## 2013-02-22 DIAGNOSIS — M5136 Other intervertebral disc degeneration, lumbar region: Secondary | ICD-10-CM

## 2013-02-22 MED ORDER — GABAPENTIN 100 MG PO CAPS: 100.0000 mg | ORAL_CAPSULE | Freq: Three times a day (TID) | ORAL | Status: DC

## 2013-02-22 NOTE — Telephone Encounter (Signed)
Pt request

## 2013-02-25 ENCOUNTER — Encounter: Payer: Self-pay | Admitting: Family Medicine

## 2013-02-25 ENCOUNTER — Ambulatory Visit (INDEPENDENT_AMBULATORY_CARE_PROVIDER_SITE_OTHER): Payer: Medicare Other | Admitting: Family Medicine

## 2013-02-25 VITALS — BP 161/74 | HR 72 | Wt 134.0 lb

## 2013-02-25 DIAGNOSIS — R109 Unspecified abdominal pain: Secondary | ICD-10-CM

## 2013-02-25 DIAGNOSIS — R1031 Right lower quadrant pain: Secondary | ICD-10-CM

## 2013-02-25 DIAGNOSIS — I10 Essential (primary) hypertension: Secondary | ICD-10-CM

## 2013-02-25 LAB — POCT URINALYSIS DIPSTICK
Bilirubin, UA: NEGATIVE
Glucose, UA: NEGATIVE
Ketones, UA: NEGATIVE
Nitrite, UA: NEGATIVE
Spec Grav, UA: 1.015
Urobilinogen, UA: 0.2

## 2013-02-25 MED ORDER — LEVOTHYROXINE SODIUM 25 MCG PO TABS: 25.0000 ug | ORAL_TABLET | Freq: Every day | ORAL | Status: DC

## 2013-02-25 MED ORDER — ISOSORBIDE MONONITRATE ER 30 MG PO TB24: 30.0000 mg | ORAL_TABLET | Freq: Every day | ORAL | Status: DC

## 2013-02-25 MED ORDER — COLCHICINE 0.6 MG PO TABS: 0.6000 mg | ORAL_TABLET | Freq: Every day | ORAL | Status: DC

## 2013-02-25 MED ORDER — METOPROLOL TARTRATE 50 MG PO TABS: 50.0000 mg | ORAL_TABLET | Freq: Two times a day (BID) | ORAL | Status: DC

## 2013-02-25 MED ORDER — METRONIDAZOLE 500 MG PO TABS: 500.0000 mg | ORAL_TABLET | Freq: Three times a day (TID) | ORAL | Status: AC

## 2013-02-25 MED ORDER — CIPROFLOXACIN HCL 500 MG PO TABS: 500.0000 mg | ORAL_TABLET | Freq: Two times a day (BID) | ORAL | Status: AC

## 2013-02-25 NOTE — Progress Notes (Signed)
CC: Natalie Hogan is a 79 y.o. female is here for RLQ pain   Subjective: HPI:  Complains of right lower quadrant pain that has been present since yesterday described as sharp stabbing sensation moderate to severe in severity nothing particularly makes it better or worse she is unable influenced the pain. She describes her radiation into the back. She's never had this before. No interventions as of yet. Has been associated with urinary frequency but no other complaints.  She denies nausea, vomiting, fevers, chills, diarrhea, constipation. She denies dysuria or hesitancy.  She denies recent over exertion or accidents. Pain was bad enough to keep her awake at night last night.  Blood pressure has been elevated multiple times at recent visits despite starting Lasix for lower extremity edema no improvement. Denies chest pain, shortness of breath, orthopnea, headache nor lightheadedness   Review Of Systems Outlined In HPI  Past Medical History  Diagnosis Date  . Heart disease   . Hypertension   . Family history of thyroid problem   . Hypothyroidism   . Hyperlipidemia   . Peripheral vascular disease   . Anginal pain   . Exertional shortness of breath     "lately" (10/12/2012)  . Arthritis     "little bit in my knees" (10/12/2012)  . Gout   . Coronary artery disease   . Diverticulitis      Family History  Problem Relation Age of Onset  . Hypertension Son      History  Substance Use Topics  . Smoking status: Former Smoker -- 10 years    Types: Cigarettes    Quit date: 05/20/1963  . Smokeless tobacco: Never Used  . Alcohol Use: No     Objective: Filed Vitals:   02/25/13 1059  BP: 161/74  Pulse: 72    General: Alert and Oriented, No Acute Distress HEENT: Pupils equal, round, reactive to light. Conjunctivae clear.  Moist mucous membranes pharynx unremarkable Lungs: Clear to auscultation bilaterally, no wheezing/ronchi/rales.  Comfortable work of breathing. Good air  movement. Cardiac: Regular rate and rhythm. Normal S1/S2.  No murmurs, rubs, nor gallops.   Abdomen: Soft without guarding or palpable masses. She has right lower quadrant tenderness to deep palpation reproducing her presenting pain with mild rebound tenderness, normal bowel sounds Extremities: No peripheral edema.  Strong peripheral pulses.  Mental Status: No depression, anxiety, nor agitation. Skin: Warm and dry.  Assessment & Plan: Natalie Hogan was seen today for rlq pain.  Diagnoses and associated orders for this visit:  Abdominal pain, unspecified site - Urinalysis Dipstick - Urine Culture  Essential hypertension, benign  RLQ abdominal pain - ciprofloxacin (CIPRO) 500 MG tablet; Take 1 tablet (500 mg total) by mouth 2 (two) times daily. - metroNIDAZOLE (FLAGYL) 500 MG tablet; Take 1 tablet (500 mg total) by mouth 3 (three) times daily.  Other Orders - colchicine 0.6 MG tablet; Take 1 tablet (0.6 mg total) by mouth daily. - levothyroxine (SYNTHROID, LEVOTHROID) 25 MCG tablet; Take 1 tablet (25 mcg total) by mouth daily before breakfast. - isosorbide mononitrate (IMDUR) 30 MG 24 hr tablet; Take 1 tablet (30 mg total) by mouth daily. - metoprolol tartrate (LOPRESSOR) 50 MG tablet; Take 1 tablet (50 mg total) by mouth 2 (two) times daily.    Urinalysis without blood there are leukocytes therefore we will culture for possibility of UTI. There is a concern that she may have a mild case of diverticulitis therefore start Cipro and Flagyl.Signs and symptoms requring emergent/urgent reevaluation were discussed with  the patient. Essential hypertension: Uncontrolled therefore increasing metoprolol, followup blood pressure visit in 2-4 weeks.  Return if symptoms worsen or fail to improve.

## 2013-02-27 LAB — URINE CULTURE: Colony Count: NO GROWTH

## 2013-03-08 ENCOUNTER — Telehealth: Payer: Self-pay | Admitting: *Deleted

## 2013-03-08 NOTE — Telephone Encounter (Signed)
Patient's son called & left a vm asking if he can give the pt kaopectate for diarrhea?  Just wanted to make sure it was ok with her medications.

## 2013-03-09 NOTE — Telephone Encounter (Signed)
Yes, that or Immodium AD is ok.

## 2013-03-09 NOTE — Telephone Encounter (Signed)
Pt's daughter in law answered the phone and she was notified of instructions

## 2013-04-25 ENCOUNTER — Ambulatory Visit (INDEPENDENT_AMBULATORY_CARE_PROVIDER_SITE_OTHER): Payer: Medicare Other | Admitting: Family Medicine

## 2013-04-25 ENCOUNTER — Encounter: Payer: Self-pay | Admitting: Family Medicine

## 2013-04-25 VITALS — BP 200/87 | HR 75 | Wt 137.0 lb

## 2013-04-25 DIAGNOSIS — E039 Hypothyroidism, unspecified: Secondary | ICD-10-CM

## 2013-04-25 DIAGNOSIS — R0602 Shortness of breath: Secondary | ICD-10-CM

## 2013-04-25 DIAGNOSIS — I10 Essential (primary) hypertension: Secondary | ICD-10-CM

## 2013-04-25 DIAGNOSIS — R609 Edema, unspecified: Secondary | ICD-10-CM

## 2013-04-25 LAB — CBC
HCT: 35.9 % — ABNORMAL LOW (ref 36.0–46.0)
MCV: 93.5 fL (ref 78.0–100.0)
Platelets: 157 10*3/uL (ref 150–400)
RBC: 3.84 MIL/uL — ABNORMAL LOW (ref 3.87–5.11)
RDW: 16.2 % — ABNORMAL HIGH (ref 11.5–15.5)
WBC: 4.8 10*3/uL (ref 4.0–10.5)

## 2013-04-25 LAB — BASIC METABOLIC PANEL WITH GFR
BUN: 29 mg/dL — ABNORMAL HIGH (ref 6–23)
GFR, Est African American: 44 mL/min — ABNORMAL LOW
Glucose, Bld: 97 mg/dL (ref 70–99)
Potassium: 4.9 mEq/L (ref 3.5–5.3)
Sodium: 138 mEq/L (ref 135–145)

## 2013-04-25 LAB — TSH: TSH: 5.905 u[IU]/mL — ABNORMAL HIGH (ref 0.350–4.500)

## 2013-04-25 MED ORDER — METOPROLOL TARTRATE 100 MG PO TABS: 100.0000 mg | ORAL_TABLET | Freq: Two times a day (BID) | ORAL | Status: DC

## 2013-04-25 NOTE — Progress Notes (Signed)
CC: Natalie Hogan is a 79 y.o. female is here for Joint Swelling   Subjective: HPI:  Bilateral leg swelling has been present for 2 weeks worsening on a weekly basis. Worse at the end of the day worse longer she stands on her feet. Slightly improved every single morning. She has Lasix but has not been taking it. No change in diet or salt intake. Symptoms are symmetric she has no history of DVT or risk factors for DVT other than kidney tumor.  She's had mild shortness of breath with exertion but not at rest denies orthopnea. Denies chest pain, irregular heartbeat, wheezing, cough, orthopnea.   Denies fevers, chills, nor any pulmonary complaints other than the the above.   Review Of Systems Outlined In HPI  Past Medical History  Diagnosis Date  . Heart disease   . Hypertension   . Family history of thyroid problem   . Hypothyroidism   . Hyperlipidemia   . Peripheral vascular disease   . Anginal pain   . Exertional shortness of breath     "lately" (10/12/2012)  . Arthritis     "little bit in my knees" (10/12/2012)  . Gout   . Coronary artery disease   . Diverticulitis      Family History  Problem Relation Age of Onset  . Hypertension Son      History  Substance Use Topics  . Smoking status: Former Smoker -- 10 years    Types: Cigarettes    Quit date: 05/20/1963  . Smokeless tobacco: Never Used  . Alcohol Use: No     Objective: Filed Vitals:   04/25/13 0947  BP: 200/87  Pulse: 75    General: Alert and Oriented, No Acute Distress HEENT: Pupils equal, round, reactive to light. Conjunctivae clear.  Moist membranes pharynx unremarkable Lungs: Clear to auscultation bilaterally, no wheezing/ronchi/rales.  Comfortable work of breathing. Good air movement. Cardiac: Regular rate and rhythm. Normal S1/S2.  No murmurs, rubs, nor gallops.   Abdomen: Normal bowel sounds, soft and non tender without palpable masses. Extremities: 1+ pitting edema of the feet and shins bilaterally.   Strong peripheral pulses.  No discoloration of either lower extremity as Mental Status: No depression, anxiety, nor agitation. Skin: Warm and dry.  Assessment & Plan: [name] was seen today for joint swelling.  Diagnoses and associated orders for this visit:  Essential hypertension, benign - metoprolol (LOPRESSOR) 100 MG tablet; Take 1 tablet (100 mg total) by mouth 2 (two) times daily. - BASIC METABOLIC PANEL WITH GFR  Hypothyroidism - TSH  Peripheral edema  SOB (shortness of breath) - CBC    Essential hypertension: Controlled increasing metoprolol Checking thyroid function to see him as this is contributing to lower extremity swelling Checking hemoglobin for history of anemia with current shortness of breath Rule out worsening renal insufficiency.  I've asked her to take Lasix 40 mg daily for the next 2 days followed by at least 3 days of 20 mg daily then as needed   Return if symptoms worsen or fail to improve.

## 2013-04-25 NOTE — Patient Instructions (Signed)
Furosemide 40mg  Monday and Tuesday, then 20mg  daily for the rest of the week.

## 2013-04-26 ENCOUNTER — Telehealth: Payer: Self-pay | Admitting: Family Medicine

## 2013-04-26 MED ORDER — LEVOTHYROXINE SODIUM 50 MCG PO TABS: 50.0000 ug | ORAL_TABLET | Freq: Every day | ORAL | Status: DC

## 2013-04-26 NOTE — Telephone Encounter (Signed)
Left message on vm

## 2013-04-26 NOTE — Telephone Encounter (Signed)
Sue Lush, Will you please let Mrs. Tolliver or her son know that her thryoid function is a bit underactive and I'd recommend she increase her levothyroxine supplement, Rx printed incase they want this faxed to mail order.  This along with the furosemide we talked about yesterday should help with swelling.

## 2013-04-26 NOTE — Telephone Encounter (Signed)
rx sent to [facility] [place] in [place]

## 2013-05-10 ENCOUNTER — Other Ambulatory Visit: Payer: Self-pay | Admitting: *Deleted

## 2013-05-10 DIAGNOSIS — M25511 Pain in right shoulder: Secondary | ICD-10-CM

## 2013-05-10 DIAGNOSIS — R609 Edema, unspecified: Secondary | ICD-10-CM

## 2013-05-10 DIAGNOSIS — M5136 Other intervertebral disc degeneration, lumbar region: Secondary | ICD-10-CM

## 2013-05-10 DIAGNOSIS — I10 Essential (primary) hypertension: Secondary | ICD-10-CM

## 2013-05-10 MED ORDER — METOPROLOL TARTRATE 100 MG PO TABS: 100.0000 mg | ORAL_TABLET | Freq: Two times a day (BID) | ORAL | Status: DC

## 2013-05-10 MED ORDER — ISOSORBIDE MONONITRATE ER 30 MG PO TB24: 30.0000 mg | ORAL_TABLET | Freq: Every day | ORAL | Status: DC

## 2013-05-10 MED ORDER — MELOXICAM 15 MG PO TABS: ORAL_TABLET | ORAL | Status: DC

## 2013-05-10 MED ORDER — LEVOTHYROXINE SODIUM 50 MCG PO TABS: 50.0000 ug | ORAL_TABLET | Freq: Every day | ORAL | Status: DC

## 2013-05-10 MED ORDER — FUROSEMIDE 20 MG PO TABS: ORAL_TABLET | ORAL | Status: DC

## 2013-05-10 MED ORDER — GABAPENTIN 100 MG PO CAPS: 100.0000 mg | ORAL_CAPSULE | Freq: Three times a day (TID) | ORAL | Status: DC

## 2013-05-10 MED ORDER — COLCHICINE 0.6 MG PO TABS: 0.6000 mg | ORAL_TABLET | Freq: Every day | ORAL | Status: DC

## 2013-05-16 ENCOUNTER — Encounter: Payer: Self-pay | Admitting: Family Medicine

## 2013-05-16 ENCOUNTER — Ambulatory Visit (INDEPENDENT_AMBULATORY_CARE_PROVIDER_SITE_OTHER): Payer: Medicare Other | Admitting: Family Medicine

## 2013-05-16 VITALS — BP 153/73 | HR 67 | Wt 135.0 lb

## 2013-05-16 DIAGNOSIS — M25529 Pain in unspecified elbow: Secondary | ICD-10-CM

## 2013-05-16 MED ORDER — METHYLPREDNISOLONE ACETATE 80 MG/ML IJ SUSP
80.0000 mg | Freq: Once | INTRAMUSCULAR | Status: AC
  Administered 2013-05-16: 80 mg via INTRAMUSCULAR

## 2013-05-16 NOTE — Progress Notes (Signed)
CC: Natalie Hogan is a 79 y.o. female is here for Arm Pain   Subjective: HPI:  Complains of diffuse joint pain in the upper extremity is that came on abruptly this morning. Moderate in severity improves with rest worse with any activity. Overall has improved since this morning. Pain is described only has pain, nonradiating, no interventions as of yet has not taken any medication for this today. She reports a history of this that it comes and goes throughout the year without any particular reason.  Her biggest concern is that this might be return of gout. She denies any recent over exertion or change in medication regimen.  She identifies her usual swelling in both lower extremity is but denies swelling redness or warmth in any of the upper extremitys. Denies fevers, chills, chest pain, irregular heartbeat, nor skin changes.   Review Of Systems Outlined In HPI  Past Medical History  Diagnosis Date  . Heart disease   . Hypertension   . Family history of thyroid problem   . Hypothyroidism   . Hyperlipidemia   . Peripheral vascular disease   . Anginal pain   . Exertional shortness of breath     "lately" (10/12/2012)  . Arthritis     "little bit in my knees" (10/12/2012)  . Gout   . Coronary artery disease   . Diverticulitis      Family History  Problem Relation Age of Onset  . Hypertension Son      History  Substance Use Topics  . Smoking status: Former Smoker -- 10 years    Types: Cigarettes    Quit date: 05/20/1963  . Smokeless tobacco: Never Used  . Alcohol Use: No     Objective: Filed Vitals:   05/16/13 1420  BP: 153/73  Pulse: 67    General: Alert and Oriented, No Acute Distress HEENT: Pupils equal, round, reactive to light. Conjunctivae clear.  Moist mucous membranes pharynx unremarkable Lungs: Clear comfortable work of breathing Cardiac: Regular rate and rhythm.  Extremities: Trace swelling/edema in the right wrist however otherwise upper extremity his are  unremarkable with respect to swelling. From the shoulders distally into the fingers there is no swelling redness or warmth in any of the upper shoulder joints other than that described above. Finkelstein's negative bilaterally, anatomical snuff box without pain bilaterally, no pain at medial or lateral condyle bilaterally. She has full range of motion strength from the elbows distally bilaterally. Pain is reproduced in the wrists only with endpoint of flexion extension abduction or abduction.  Pain fingers is reproduced with grip strength beyond 4/5.  Strong peripheral pulses.  Mental Status: No depression, anxiety, nor agitation. Skin: Warm and dry.  Assessment & Plan: Natalie Hogan was seen today for arm pain.  Diagnoses and associated orders for this visit:  Pain in joint, upper arm - methylPREDNISolone acetate (DEPO-MEDROL) injection 80 mg; Inject 1 mL (80 mg total) into the muscle once.    Discussed very low suspicion of gout flare reassurance provided.  Suspect a sudden worsening of osteoarthritis given diffuse nature of pain, if returns in the near future or persists we'll consider rheumatologic labs. Avoiding nonsteroidal anti-inflammatories if possible given her renal insufficiency, given Depo-Medrol today. Return for sports medicine referral and followup if not improved by end of the week. Encouraged to look into water aerobics once feeling better  25 minutes spent face-to-face during visit today of which at least 50% was counseling or coordinating care regarding joint pain in the upper arms.  Return if symptoms worsen or fail to improve.

## 2013-07-29 ENCOUNTER — Encounter: Payer: Self-pay | Admitting: Family Medicine

## 2013-07-29 ENCOUNTER — Ambulatory Visit (INDEPENDENT_AMBULATORY_CARE_PROVIDER_SITE_OTHER): Payer: Medicare Other | Admitting: Family Medicine

## 2013-07-29 VITALS — BP 188/81 | HR 56 | Wt 134.0 lb

## 2013-07-29 DIAGNOSIS — I10 Essential (primary) hypertension: Secondary | ICD-10-CM

## 2013-07-29 DIAGNOSIS — M19049 Primary osteoarthritis, unspecified hand: Secondary | ICD-10-CM

## 2013-07-29 MED ORDER — PREDNISONE 20 MG PO TABS: ORAL_TABLET | ORAL | Status: AC

## 2013-07-29 NOTE — Patient Instructions (Signed)
Please check blood pressure twice a day and drop off the values next week.  [address]  Sun  Mon  Tues  Wed  Thurs  Fri

## 2013-07-29 NOTE — Progress Notes (Signed)
CC: Natalie Hogan is a 80 y.o. female is here for pain in hands   Subjective: HPI:  Complaints of bilateral hand pain that was moderate to severe yesterday when he came on gradually however without intervention has improved to almost completely absent now. Localized in all knuckles involving both hands equally. Described only as a soreness and nonradiating. Accompanied by mild swelling and redness overlying the site of pain. Pain was worse with action and extension of her knuckles. She denies any recent over exertion or trauma. She denies fevers, chills, flushing, nor motor or sensory disturbances. She believes she has her full strength back in both hands.  She's unsure whether or not she took her blood pressure medication this morning specifically Imdur and metoprolol. She's been checking her blood pressure at home sporadically and thinks that it's been well-controlled however does not know any objective measurements.   Review Of Systems Outlined In HPI  Past Medical History  Diagnosis Date  . Heart disease   . Hypertension   . Family history of thyroid problem   . Hypothyroidism   . Hyperlipidemia   . Peripheral vascular disease   . Anginal pain   . Exertional shortness of breath     "lately" (10/12/2012)  . Arthritis     "little bit in my knees" (10/12/2012)  . Gout   . Coronary artery disease   . Diverticulitis     Past Surgical History  Procedure Laterality Date  . Cesarean section  1941; 1949  . Appendectomy  ~ 1937  . Tubal ligation  1949  . Cataract extraction w/ intraocular lens  implant, bilateral Bilateral ~ 2006  . Angioplasty      "attempted to put a stent in my right leg ~ 8 yr ago; vessel was too small" (10/12/2012)   Family History  Problem Relation Age of Onset  . Hypertension Son     History   Social History  . Marital Status: Widowed    Spouse Name: N/A    Number of Children: N/A  . Years of Education: N/A   Occupational History  . Not on file.    Social History Main Topics  . Smoking status: Former Smoker -- 10 years    Types: Cigarettes    Quit date: 05/20/1963  . Smokeless tobacco: Never Used  . Alcohol Use: No  . Drug Use: No  . Sexual Activity: No   Other Topics Concern  . Not on file   Social History Narrative  . No narrative on file     Objective: BP 188/81  Pulse 56  Wt 134 lb (60.782 kg)  General: Alert and Oriented, No Acute Distress HEENT: Pupils equal, round, reactive to light. Conjunctivae clear.  Moist mucous membranes pharynx unremarkable Lungs: Clear and comfortable work of breathing Cardiac: Regular rate and rhythm.  Extremities: No peripheral edema.  Strong peripheral pulses. Full range of motion strength of both hands and wrists without pain. There is mild swelling overlying all 5 knuckles on both hands but not tender to palpation. No pain in anatomical snuff box bilaterally, Finkelstein's negative bilaterally Mental Status: No depression, anxiety, nor agitation. Skin: Warm and dry.  Assessment & Plan: Natalie Hogan was seen today for pain in hands.  Diagnoses and associated orders for this visit:  Osteoarthritis, hand - predniSONE (DELTASONE) 20 MG tablet; Two tabs at once daily for five days.  Essential hypertension, benign    Osteoarthritis: We will like to avoid nonsteroidal anti-inflammatories given her compromised kidney function. I've  offered her prednisone however she states that since the pain is almost completely gone she rather not take it however will hold on the prescription if pain returns over the weekend. Essential hypertension: Controlled chronic condition I've given her a diary for blood pressure measurements to be taken twice a day for the next week to have asked her to return or drop off these measurements late next week to see if we need to titrate metoprolol   Return in about 1 week (around 08/05/2013).

## 2013-08-03 ENCOUNTER — Telehealth: Payer: Self-pay | Admitting: *Deleted

## 2013-08-03 NOTE — Telephone Encounter (Signed)
Pt's son called and left a message that he wanted to discuss pt's BP readings. Called Theodoro GristDave back and let him know that he could call back and let me know what the readings are or if he needed to speak  to a phone nurse for advise and reccomendations to call the triage line.

## 2013-08-05 ENCOUNTER — Encounter: Payer: Self-pay | Admitting: Family Medicine

## 2013-08-05 ENCOUNTER — Ambulatory Visit (INDEPENDENT_AMBULATORY_CARE_PROVIDER_SITE_OTHER): Payer: Medicare Other | Admitting: Family Medicine

## 2013-08-05 ENCOUNTER — Ambulatory Visit (INDEPENDENT_AMBULATORY_CARE_PROVIDER_SITE_OTHER): Payer: Medicare Other

## 2013-08-05 VITALS — BP 160/78 | HR 67 | Ht 59.0 in | Wt 136.0 lb

## 2013-08-05 DIAGNOSIS — M79609 Pain in unspecified limb: Secondary | ICD-10-CM

## 2013-08-05 DIAGNOSIS — I10 Essential (primary) hypertension: Secondary | ICD-10-CM

## 2013-08-05 DIAGNOSIS — M79641 Pain in right hand: Secondary | ICD-10-CM

## 2013-08-05 DIAGNOSIS — M79642 Pain in left hand: Secondary | ICD-10-CM

## 2013-08-05 DIAGNOSIS — M19049 Primary osteoarthritis, unspecified hand: Secondary | ICD-10-CM

## 2013-08-05 MED ORDER — METOPROLOL TARTRATE 100 MG PO TABS: 200.0000 mg | ORAL_TABLET | Freq: Two times a day (BID) | ORAL | Status: DC

## 2013-08-05 NOTE — Progress Notes (Signed)
CC: Annia FriendlyHelen House is a 80 y.o. female is here for Hypertension   Subjective: HPI:  Followup hypertension: Earlier this week she was found to have a systolic blood pressure of 212 she was sent to the emergency room by her family, labs there were remarkable only for creatinine of 2.1 on the day she took a Lasix. This is the only thing she took this week. Blood pressure resolved to a 160/90 without any intervention other than resting. She was discharged with the only change being increasing IMDUR.  Continues to take metoprolol 100 mg twice a day. Blood pressure seems to be worse the more she fixates on it. She brings a blood pressure log for the past week all blood pressures are in stage II hypertension. Denies chest pain shortness of breath orthopnea motor sensory disturbances  Complains of chronic hand pain that is localized to the wrist and all knuckles bilaterally there has been present for the past 2-3 years, symptoms come and go on a weekly basis. Worse in cold weather she improves with heat. Described only has pain has improved with prednisone and anti-inflammatories however she has chronic renal failure.  Reports swelling and localized to the site of her pain it is mild in severity when pain is present. Denies redness warmth or any overlying skin changes. Denies any recent or remote trauma. Today she is painless  Review Of Systems Outlined In HPI  Past Medical History  Diagnosis Date  . Heart disease   . Hypertension   . Family history of thyroid problem   . Hypothyroidism   . Hyperlipidemia   . Peripheral vascular disease   . Anginal pain   . Exertional shortness of breath     "lately" (10/12/2012)  . Arthritis     "little bit in my knees" (10/12/2012)  . Gout   . Coronary artery disease   . Diverticulitis     Past Surgical History  Procedure Laterality Date  . Cesarean section  1941; 1949  . Appendectomy  ~ 1937  . Tubal ligation  1949  . Cataract extraction w/ intraocular  lens  implant, bilateral Bilateral ~ 2006  . Angioplasty      "attempted to put a stent in my right leg ~ 8 yr ago; vessel was too small" (10/12/2012)   Family History  Problem Relation Age of Onset  . Hypertension Son     History   Social History  . Marital Status: Widowed    Spouse Name: N/A    Number of Children: N/A  . Years of Education: N/A   Occupational History  . Not on file.   Social History Main Topics  . Smoking status: Former Smoker -- 10 years    Types: Cigarettes    Quit date: 05/20/1963  . Smokeless tobacco: Never Used  . Alcohol Use: No  . Drug Use: No  . Sexual Activity: No   Other Topics Concern  . Not on file   Social History Narrative  . No narrative on file     Objective: BP 160/78  Pulse 67  Ht 4\' 11"  (1.499 m)  Wt 136 lb (61.689 kg)  BMI 27.45 kg/m2  General: Alert and Oriented, No Acute Distress HEENT: Pupils equal, round, reactive to light. Conjunctivae clear.   Moist mucous membranes pharynx unremarkable  Lungs: Clear to auscultation bilaterally, no wheezing/ronchi/rales.  Comfortable work of breathing. Good air movement. Cardiac: Regular rate and rhythm. Normal S1/S2.  No murmurs, rubs, nor gallops.   Extremities: No  peripheral edema.  Strong peripheral pulses.  full range of motion and strength in both wrists and throughout the hand.  No pain in snuff box bilaterally negative Finklestein's. No pain manipulation of the knuckles or wrists. Mental Status: No depression, anxiety, nor agitation. Skin: Warm and dry.  Assessment & Plan: Shawntell was seen today for hypertension.  Diagnoses and associated orders for this visit:  Essential hypertension, benign - metoprolol (LOPRESSOR) 100 MG tablet; Take 2 tablets (200 mg total) by mouth 2 (two) times daily.  Bilateral hand pain - DG Hand Complete Right; Future - DG Hand Complete Left; Future     bilateral hand pain: We obtained x-rays today so that if pain returns she can return to see  Dr. Karie Schwalbe. in sports medicine for referral for candidacy of steroid injection based on her degeneration is Essential hypertension: Controlled go back to original dose of imdur, increasing metoprolol, drop off blood pressures and pulse next week prior to going to [place]  Return in about 4 weeks (around 09/02/2013).

## 2013-08-18 ENCOUNTER — Ambulatory Visit (INDEPENDENT_AMBULATORY_CARE_PROVIDER_SITE_OTHER): Payer: Medicare Other | Admitting: Sports Medicine

## 2013-08-18 ENCOUNTER — Encounter: Payer: Self-pay | Admitting: Sports Medicine

## 2013-08-18 VITALS — BP 179/73 | HR 62 | Ht <= 58 in | Wt 136.0 lb

## 2013-08-18 DIAGNOSIS — M19049 Primary osteoarthritis, unspecified hand: Secondary | ICD-10-CM

## 2013-08-18 NOTE — Assessment & Plan Note (Signed)
Bilateral injections placed into the radiocarpal joints as well as the trapezial metacarpal joints. Return to see me in one month. Both hands and wrists were strapped with compressive dressing.

## 2013-08-18 NOTE — Progress Notes (Signed)
Subjective:    I'm seeing this patient as a consultation for:  Dr. Ivan AnchorsHommel  CC: Bilateral hand and wrist pain  HPI: This is a pleasant 80 year old female, she comes in with a decade long history of pain that she localizes at the radiocarpal joint dorsally on both wrists, as well as at the trapezial metacarpal joint bilaterally in both hands. She does not have any pain at the medical phalangeal or interphalangeal joints. Denies any numbness or tingling into the hand. Pain is moderate, persistent, she has been limited with NSAIDs due to mild renal insufficiency.  Past medical history, Surgical history, Family history not pertinant except as noted below, Social history, Allergies, and medications have been entered into the medical record, reviewed, and no changes needed.   Review of Systems: No headache, visual changes, nausea, vomiting, diarrhea, constipation, dizziness, abdominal pain, skin rash, fevers, chills, night sweats, weight loss, swollen lymph nodes, body aches, joint swelling, muscle aches, chest pain, shortness of breath, mood changes, visual or auditory hallucinations.   Objective:   General: Well Developed, well nourished, and in no acute distress.  Neuro/Psych: Alert and oriented x3, extra-ocular muscles intact, able to move all 4 extremities, sensation grossly intact. Skin: Warm and dry, no rashes noted.  Respiratory: Not using accessory muscles, speaking in full sentences, trachea midline.  Cardiovascular: Pulses palpable, no extremity edema. Abdomen: Does not appear distended. Bilateral Wrist: There is visible swelling and edema in both wrists with tenderness to palpation at the radiocarpal joint dorsally.  There is reproduction of pain with passive extension of the wrist. Palpation is normal over metacarpals, navicular, lunate, and TFCC; tendons without tenderness/ swelling No snuffbox tenderness. No tenderness over Canal of Guyon. Strength 5/5 in all directions without  pain. Negative Finkelstein, tinel's and phalens. Negative Watson's test. There is also discrete tenderness to palpation trapeziometacarpal joint bilaterally.  Procedure: Real-time Ultrasound Guided Injection of left radiocarpal joint Device: GE Logiq E  Verbal informed consent obtained.  Time-out conducted.  Noted no overlying erythema, induration, or other signs of local infection.  Skin prepped in a sterile fashion.  Local anesthesia: Topical Ethyl chloride.  With sterile technique and under real time ultrasound guidance:  1 cc Kenalog 40, 2 cc lidocaine injected easily between the radius and the lunate. Completed without difficulty  Pain immediately resolved suggesting accurate placement of the medication.  Advised to call if fevers/chills, erythema, induration, drainage, or persistent bleeding.  Images permanently stored and available for review in the ultrasound unit.  Impression: Technically successful ultrasound guided injection.  Procedure: Real-time Ultrasound Guided Injection of right radiocarpal joint Device: GE Logiq E  Verbal informed consent obtained.  Time-out conducted.  Noted no overlying erythema, induration, or other signs of local infection.  Skin prepped in a sterile fashion.  Local anesthesia: Topical Ethyl chloride.  With sterile technique and under real time ultrasound guidance:  1 cc Kenalog 40, 2 cc lidocaine injected easily between the radius and the lunate. Completed without difficulty  Pain immediately resolved suggesting accurate placement of the medication.  Advised to call if fevers/chills, erythema, induration, drainage, or persistent bleeding.  Images permanently stored and available for review in the ultrasound unit.  Impression: Technically successful ultrasound guided injection.  Procedure: Real-time Ultrasound Guided Injection of left trapeziometacarpal joint Device: GE Logiq E  Verbal informed consent obtained.  Time-out conducted.  Noted no  overlying erythema, induration, or other signs of local infection.  Skin prepped in a sterile fashion.  Local anesthesia: Topical Ethyl  chloride.  With sterile technique and under real time ultrasound guidance:  0.5 cc Kenalog 40, 1 cc lidocaine injected easily between the trapezium and first metacarpal. Completed without difficulty  Pain immediately resolved suggesting accurate placement of the medication.  Advised to call if fevers/chills, erythema, induration, drainage, or persistent bleeding.  Images permanently stored and available for review in the ultrasound unit.  Impression: Technically successful ultrasound guided injection.  Procedure: Real-time Ultrasound Guided Injection of right trapeziometacarpal joint Device: GE Logiq E  Verbal informed consent obtained.  Time-out conducted.  Noted no overlying erythema, induration, or other signs of local infection.  Skin prepped in a sterile fashion.  Local anesthesia: Topical Ethyl chloride.  With sterile technique and under real time ultrasound guidance:  0.5 cc Kenalog 40, 1 cc lidocaine injected easily between the trapezium and first metacarpal. Completed without difficulty  Pain immediately resolved suggesting accurate placement of the medication.  Advised to call if fevers/chills, erythema, induration, drainage, or persistent bleeding.  Images permanently stored and available for review in the ultrasound unit.  Impression: Technically successful ultrasound guided injection.  Both hands/wrists were strapped with compressive dressing.  Impression and Recommendations:   This case required medical decision making of moderate complexity.

## 2013-09-05 ENCOUNTER — Ambulatory Visit (INDEPENDENT_AMBULATORY_CARE_PROVIDER_SITE_OTHER): Payer: Medicare Other | Admitting: Family Medicine

## 2013-09-05 ENCOUNTER — Encounter: Payer: Self-pay | Admitting: Family Medicine

## 2013-09-05 VITALS — BP 173/71 | HR 65 | Temp 97.5°F | Wt 132.0 lb

## 2013-09-05 DIAGNOSIS — J069 Acute upper respiratory infection, unspecified: Secondary | ICD-10-CM

## 2013-09-05 DIAGNOSIS — I10 Essential (primary) hypertension: Secondary | ICD-10-CM

## 2013-09-05 MED ORDER — AMLODIPINE BESYLATE 5 MG PO TABS: 5.0000 mg | ORAL_TABLET | Freq: Every day | ORAL | Status: DC

## 2013-09-05 NOTE — Progress Notes (Signed)
CC: Natalie Hogan is a 80 y.o. female is here for cough and Sob   Subjective: HPI:  Complains of a nonproductive cough that has been present for the past 2 days came on acutely and has gradually been improving with only one dose of Robitussin. No other interventions as of yet. This persisted throughout the day and nothing particularly makes it better or worse other than above.  She denies fevers, chills, shortness of breath, wheezing, nor chest discomfort. It has been accompanied by nasal congestion.  Followup hypertension: No outside blood pressures to report. Since going up on metoprolol blood pressure has not significantly improved in our office or when seen Dr. Karie Schwalbe. in sports medicine. He denies chest pain, shortness of breath, orthopnea, peripheral edema, nor motor or sensory disturbances   Review Of Systems Outlined In HPI  Past Medical History  Diagnosis Date  . Heart disease   . Hypertension   . Family history of thyroid problem   . Hypothyroidism   . Hyperlipidemia   . Peripheral vascular disease   . Anginal pain   . Exertional shortness of breath     "lately" (10/12/2012)  . Arthritis     "little bit in my knees" (10/12/2012)  . Gout   . Coronary artery disease   . Diverticulitis     Past Surgical History  Procedure Laterality Date  . Cesarean section  1941; 1949  . Appendectomy  ~ 1937  . Tubal ligation  1949  . Cataract extraction w/ intraocular lens  implant, bilateral Bilateral ~ 2006  . Angioplasty      "attempted to put a stent in my right leg ~ 8 yr ago; vessel was too small" (10/12/2012)   Family History  Problem Relation Age of Onset  . Hypertension Son     History   Social History  . Marital Status: Widowed    Spouse Name: N/A    Number of Children: N/A  . Years of Education: N/A   Occupational History  . Not on file.   Social History Main Topics  . Smoking status: Former Smoker -- 10 years    Types: Cigarettes    Quit date: 05/20/1963  .  Smokeless tobacco: Never Used  . Alcohol Use: No  . Drug Use: No  . Sexual Activity: No   Other Topics Concern  . Not on file   Social History Narrative  . No narrative on file     Objective: BP 173/71  Pulse 65  Temp(Src) 97.5 F (36.4 C) (Oral)  Wt 132 lb (59.875 kg)  SpO2 93%  General: Alert and Oriented, No Acute Distress HEENT: Pupils equal, round, reactive to light. Conjunctivae clear.  External ears unremarkable, canals clear with intact TMs with appropriate landmarks.  Middle ear appears to have a serous effusion bilaterally. Pink inferior turbinates.  Moist mucous membranes, pharynx without inflammation nor lesions.  Neck supple without palpable lymphadenopathy nor abnormal masses. Lungs: Clear to auscultation bilaterally, no wheezing/ronchi/rales.  Comfortable work of breathing. Good air movement. Cardiac: Regular rate and rhythm. Normal S1/S2.  No murmurs, rubs, nor gallops.   Mental Status: No depression, anxiety, nor agitation. Skin: Warm and dry.  Assessment & Plan: Natalie Hogan was seen today for cough and sob.  Diagnoses and associated orders for this visit:  Essential hypertension, benign - amLODipine (NORVASC) 5 MG tablet; Take 1 tablet (5 mg total) by mouth daily.  Viral URI    Viral URI: Encouraged to focus on symptomatic care using Coricidin HBP  on an as-needed basis.Signs and symptoms requring emergent/urgent reevaluation were discussed with the patient. Essential hypertension: Uncontrolled chronic condition continued metoprolol, adding amlodipine   Return in about 4 weeks (around 10/03/2013).

## 2013-09-15 ENCOUNTER — Ambulatory Visit (INDEPENDENT_AMBULATORY_CARE_PROVIDER_SITE_OTHER): Payer: Medicare Other

## 2013-09-15 ENCOUNTER — Ambulatory Visit (INDEPENDENT_AMBULATORY_CARE_PROVIDER_SITE_OTHER): Payer: Medicare Other | Admitting: Sports Medicine

## 2013-09-15 ENCOUNTER — Encounter: Payer: Self-pay | Admitting: Sports Medicine

## 2013-09-15 VITALS — BP 146/80 | HR 60 | Ht <= 58 in | Wt 132.0 lb

## 2013-09-15 DIAGNOSIS — M19049 Primary osteoarthritis, unspecified hand: Secondary | ICD-10-CM

## 2013-09-15 DIAGNOSIS — M179 Osteoarthritis of knee, unspecified: Secondary | ICD-10-CM

## 2013-09-15 DIAGNOSIS — R059 Cough, unspecified: Secondary | ICD-10-CM

## 2013-09-15 DIAGNOSIS — M899 Disorder of bone, unspecified: Secondary | ICD-10-CM

## 2013-09-15 DIAGNOSIS — R05 Cough: Secondary | ICD-10-CM | POA: Insufficient documentation

## 2013-09-15 DIAGNOSIS — I517 Cardiomegaly: Secondary | ICD-10-CM

## 2013-09-15 DIAGNOSIS — M1710 Unilateral primary osteoarthritis, unspecified knee: Secondary | ICD-10-CM

## 2013-09-15 DIAGNOSIS — M949 Disorder of cartilage, unspecified: Secondary | ICD-10-CM

## 2013-09-15 DIAGNOSIS — IMO0002 Reserved for concepts with insufficient information to code with codable children: Secondary | ICD-10-CM

## 2013-09-15 MED ORDER — AZITHROMYCIN 250 MG PO TABS: ORAL_TABLET | ORAL | Status: DC

## 2013-09-15 NOTE — Progress Notes (Addendum)
  Subjective:    CC: Followup  HPI: Bilateral hand/wrist arthritis: Completely pain-free now after bilateral injections into the radiocarpal joint, as well as both trapeziometacarpal joints.  Bilateral knee pain: History of osteoarthritis, currently taking Tylenol without any improvement in pain, desires interventional treatment today. Pain is localized to the joint lines, moderate, persistent. Worse with weightbearing.  Cough: Worsening, present for several weeks now, productive, no constitutional symptoms, mild shortness of breath.  Past medical history, Surgical history, Family history not pertinant except as noted below, Social history, Allergies, and medications have been entered into the medical record, reviewed, and no changes needed.   Review of Systems: No fevers, chills, night sweats, weight loss, chest pain, or shortness of breath.   Objective:    General: Well Developed, well nourished, and in no acute distress.  Neuro: Alert and oriented x3, extra-ocular muscles intact, sensation grossly intact.  HEENT: Normocephalic, atraumatic, pupils equal round reactive to light, neck supple, no masses, no lymphadenopathy, thyroid nonpalpable.  Skin: Warm and dry, no rashes. Cardiac: Regular rate and rhythm, no murmurs rubs or gallops, no lower extremity edema.  Respiratory: Coarse lung sounds with occasional crackles bilaterally. Not using accessory muscles, speaking in full sentences. Bilateral Knee: Normal to inspection with no erythema or effusion or obvious bony abnormalities. Tender to palpation of both joint lines. ROM full in flexion and extension and lower leg rotation. Ligaments with solid consistent endpoints including ACL, PCL, LCL, MCL. Negative Mcmurray's, Apley's, and Thessalonian tests. Non painful patellar compression. Patellar and quadriceps tendons unremarkable. Hamstring and quadriceps strength is normal.   Procedure:  Injection of left knee Consent obtained and  verified. Time-out conducted. Noted no overlying erythema, induration, or other signs of local infection. Skin prepped in a sterile fashion. Topical analgesic spray: Ethyl chloride. Completed without difficulty. Meds: 2 cc Kenalog 40, 4 cc lidocaine injected from an anterior approach. Pain immediately improved suggesting accurate placement of the medication. Advised to call if fevers/chills, erythema, induration, drainage, or persistent bleeding.  Procedure:  Injection of right knee Consent obtained and verified. Time-out conducted. Noted no overlying erythema, induration, or other signs of local infection. Skin prepped in a sterile fashion. Topical analgesic spray: Ethyl chloride. Completed without difficulty. Meds: 2 cc Kenalog 40, 4 cc lidocaine injected from an anterior approach. Pain immediately improved suggesting accurate placement of the medication. Advised to call if fevers/chills, erythema, induration, drainage, or persistent bleeding.  Chest x-ray reviewed and is negative for infiltrate.  Impression and Recommendations:

## 2013-09-15 NOTE — Assessment & Plan Note (Signed)
Completely resolved after bilateral radiocarpal and bilateral trapezial metacarpal joint injections at the last visit.

## 2013-09-15 NOTE — Assessment & Plan Note (Signed)
Persistent now for greater than 2 weeks. She does have abnormal lung sounds. Chest x-ray, azithromycin, return to see PCP in 2 weeks.

## 2013-09-15 NOTE — Assessment & Plan Note (Signed)
Bilateral knee injection as above.

## 2013-09-23 ENCOUNTER — Telehealth: Payer: Self-pay | Admitting: *Deleted

## 2013-09-23 NOTE — Telephone Encounter (Signed)
Pts son called and states pt c/o legs looking "black and blue" but its starting to go away now. Son states pt denies pain, swelling or SOB and he is unconcerned. He states they have discussed this with the doctor and she has switched taking the baby asa to qod. I advised if edema, pain or discoloration comes back, and or if its accompanied with SOB over the weekend to go to  ER. Son voiced understanding

## 2013-09-29 ENCOUNTER — Telehealth: Payer: Self-pay | Admitting: *Deleted

## 2013-09-29 DIAGNOSIS — I10 Essential (primary) hypertension: Secondary | ICD-10-CM

## 2013-09-29 MED ORDER — AMLODIPINE BESYLATE 5 MG PO TABS: 5.0000 mg | ORAL_TABLET | Freq: Every day | ORAL | Status: DC

## 2013-09-29 NOTE — Telephone Encounter (Signed)
[id number]. It is cheaper for them.

## 2013-10-26 ENCOUNTER — Other Ambulatory Visit: Payer: Self-pay | Admitting: *Deleted

## 2013-10-26 DIAGNOSIS — M5136 Other intervertebral disc degeneration, lumbar region: Secondary | ICD-10-CM

## 2013-10-26 DIAGNOSIS — M25511 Pain in right shoulder: Secondary | ICD-10-CM

## 2013-10-26 MED ORDER — LEVOTHYROXINE SODIUM 50 MCG PO TABS: 50.0000 ug | ORAL_TABLET | Freq: Every day | ORAL | Status: DC

## 2013-10-26 MED ORDER — MELOXICAM 15 MG PO TABS: ORAL_TABLET | ORAL | Status: DC

## 2013-10-26 MED ORDER — GABAPENTIN 100 MG PO CAPS: 100.0000 mg | ORAL_CAPSULE | Freq: Three times a day (TID) | ORAL | Status: DC

## 2013-10-26 MED ORDER — COLCHICINE 0.6 MG PO TABS: 0.6000 mg | ORAL_TABLET | Freq: Every day | ORAL | Status: DC

## 2013-11-02 ENCOUNTER — Encounter: Payer: Self-pay | Admitting: Family Medicine

## 2013-11-02 ENCOUNTER — Ambulatory Visit (INDEPENDENT_AMBULATORY_CARE_PROVIDER_SITE_OTHER): Payer: Medicare Other | Admitting: Family Medicine

## 2013-11-02 VITALS — BP 141/66 | HR 59 | Wt 140.0 lb

## 2013-11-02 DIAGNOSIS — R233 Spontaneous ecchymoses: Secondary | ICD-10-CM

## 2013-11-02 DIAGNOSIS — I10 Essential (primary) hypertension: Secondary | ICD-10-CM

## 2013-11-02 DIAGNOSIS — R238 Other skin changes: Secondary | ICD-10-CM

## 2013-11-02 DIAGNOSIS — R609 Edema, unspecified: Secondary | ICD-10-CM

## 2013-11-02 DIAGNOSIS — E039 Hypothyroidism, unspecified: Secondary | ICD-10-CM

## 2013-11-02 MED ORDER — AMBULATORY NON FORMULARY MEDICATION: Status: AC

## 2013-11-02 NOTE — Progress Notes (Signed)
**Natalie Hogan** CC: Natalie Hogan is a 80 y.o. female is here for Medication Management and Shoulder Pain   Subjective: HPI:  Followup essential hypertension: Continues to take amlodipine and metoprolol a daily basis without missed doses. No outside blood pressures to report. Denies chest pain shortness of breath orthopnea nor PND. Denies motor or sensory disturbances  Followup hypothyroidism: Continues to take levothyroxine on a daily basis no recent missed doses. Denies unintentional weight loss or gain, anxiety or depression. Denies skin changes other than easy bruisability over the past 2 weeks. She localizes bruising on both forearms and on the bus. bruising occurs within hours after she bumps into any objects at the locations described above. She's taking aspirin every other day.   followup edema: She denies worsening bilateral lower extremity edema that has been present for the past 2 days that has worsened since [place] stationary in her car for about 8 hours. Worse the longer she stands or is inactive sitting improves with elevation of the legs. Absent for the first few minutes she is awake, she does not wear compression stockings due to discomfort.      Review Of Systems Outlined In HPI  Past Medical History  Diagnosis Date  . Heart disease   . Hypertension   . Family history of thyroid problem   . Hypothyroidism   . Hyperlipidemia   . Peripheral vascular disease   . Anginal pain   . Exertional shortness of breath     "lately" (10/12/2012)  . Arthritis     "little bit in my knees" (10/12/2012)  . Gout   . Coronary artery disease   . Diverticulitis     Past Surgical History  Procedure Laterality Date  . Cesarean section  1941; 1949  . Appendectomy  ~ 1937  . Tubal ligation  1949  . Cataract extraction w/ intraocular lens  implant, bilateral Bilateral ~ 2006  . Angioplasty      "attempted to put a stent in my right leg ~ 8 yr ago; vessel was too small"  (10/12/2012)   Family History  Problem Relation Age of Onset  . Hypertension Son     History   Social History  . Marital Status: Widowed    Spouse Name: N/A    Number of Children: N/A  . Years of Education: N/A   Occupational History  . Not on file.   Social History Main Topics  . Smoking status: Former Smoker -- 10 years    Types: Cigarettes    Quit date: 05/20/1963  . Smokeless tobacco: Never Used  . Alcohol Use: No  . Drug Use: No  . Sexual Activity: No   Other Topics Concern  . Not on file   Social History Narrative  . No narrative on file     Objective: BP 141/66  Pulse 59  Wt 140 lb (63.504 kg)  General: Alert and Oriented, No Acute Distress HEENT: Pupils equal, round, reactive to light. Conjunctivae clear.   moist mucous membranes pharynx unremarkable  Neck supple without palpable lymphadenopathy nor abnormal masses. Lungs: Clear to auscultation bilaterally, no wheezing/ronchi/rales.  Comfortable work of breathing. Good air movement. Cardiac: Regular rate and rhythm. Normal S1/S2.  No murmurs, rubs, nor gallops.   Abdomen:  soft nontender  Extremities:  1+ pitting edema in both lower extremities from the ankles proximal to the tibial tuberosity, symmetric without weeping  Mental Status: No depression, anxiety, nor agitation. Skin: Warm and dry.  Assessment & Plan: [name]  was seen today for medication management and shoulder pain.  Diagnoses and associated orders for this visit:  Hypothyroidism - TSH  Essential hypertension, benign - BASIC METABOLIC PANEL WITH GFR  Easy bruising - CBC  Edema     hypothyroidism: Clinically control but due for TSH continue current levothyroxine regimen pending results   essential hypertension: Controlled continue amlodipine and metoprolol Easy bruising: Continue current aspirin regimen, checking platelets Edema: Stop gabapentin for the next week, notify me of any new pain occurs after stopping this. Hopefully this  will help some with her edema I would like her to restart Lasix 1-2 times a day as needed for edema. Checking renal function today and she has followup with nephrology tomorrow. We'll provide her with a prescription for custom for compression stockings to be worn during waking hours  Return in about 3 months (around 02/02/2014) for Routine Follow Up.

## 2013-11-03 ENCOUNTER — Telehealth: Payer: Self-pay | Admitting: Family Medicine

## 2013-11-03 DIAGNOSIS — D649 Anemia, unspecified: Secondary | ICD-10-CM

## 2013-11-03 DIAGNOSIS — D539 Nutritional anemia, unspecified: Secondary | ICD-10-CM

## 2013-11-03 LAB — BASIC METABOLIC PANEL WITH GFR
BUN: 26 mg/dL — ABNORMAL HIGH (ref 6–23)
CO2: 26 meq/L (ref 19–32)
CREATININE: 1.14 mg/dL — AB (ref 0.50–1.10)
Calcium: 8.9 mg/dL (ref 8.4–10.5)
Chloride: 106 mEq/L (ref 96–112)
GFR, Est African American: 48 mL/min — ABNORMAL LOW
GFR, Est Non African American: 42 mL/min — ABNORMAL LOW
Glucose, Bld: 90 mg/dL (ref 70–99)
Potassium: 4.8 mEq/L (ref 3.5–5.3)
SODIUM: 138 meq/L (ref 135–145)

## 2013-11-03 LAB — CBC
HCT: 31.7 % — ABNORMAL LOW (ref 36.0–46.0)
HEMOGLOBIN: 10.6 g/dL — AB (ref 12.0–15.0)
MCH: 31.9 pg (ref 26.0–34.0)
MCHC: 33.4 g/dL (ref 30.0–36.0)
MCV: 95.5 fL (ref 78.0–100.0)
Platelets: 120 10*3/uL — ABNORMAL LOW (ref 150–400)
RBC: 3.32 MIL/uL — ABNORMAL LOW (ref 3.87–5.11)
RDW: 17.2 % — ABNORMAL HIGH (ref 11.5–15.5)
WBC: 4.4 10*3/uL (ref 4.0–10.5)

## 2013-11-03 LAB — TSH: TSH: 5.204 u[IU]/mL — ABNORMAL HIGH (ref 0.350–4.500)

## 2013-11-03 MED ORDER — LEVOTHYROXINE SODIUM 75 MCG PO TABS: 75.0000 ug | ORAL_TABLET | Freq: Every day | ORAL | Status: DC

## 2013-11-03 NOTE — Telephone Encounter (Signed)
Sue Lushndrea, Will you please let patient or her son know that her anemia has returned and may be due to her bruising.  I'd recommend that she return for a lab only visit to see if this is due to a vitamin b or iron deficiency (labs in your inbox).  Her tyroid supplementation appears under-dosed and I'd encourage her to increase her dose of levothyroxine to 75mcg daily (rx printed in your inbox for pickup for fax to the [facility] per her preference).  Kidney function appears stable.  F/U to be determined based on the above labs.

## 2013-11-04 NOTE — Telephone Encounter (Signed)
Left message on son's vm

## 2013-11-07 ENCOUNTER — Telehealth: Payer: Self-pay | Admitting: Family Medicine

## 2013-11-07 ENCOUNTER — Other Ambulatory Visit: Payer: Self-pay | Admitting: *Deleted

## 2013-11-07 LAB — IRON AND TIBC
%SAT: 28 % (ref 20–55)
IRON: 84 ug/dL (ref 42–145)
TIBC: 300 ug/dL (ref 250–470)
UIBC: 216 ug/dL (ref 125–400)

## 2013-11-07 LAB — FOLATE

## 2013-11-07 LAB — VITAMIN B12: Vitamin B-12: 1398 pg/mL — ABNORMAL HIGH (ref 211–911)

## 2013-11-07 MED ORDER — LEVOTHYROXINE SODIUM 75 MCG PO TABS: 75.0000 ug | ORAL_TABLET | Freq: Every day | ORAL | Status: DC

## 2013-11-07 NOTE — Telephone Encounter (Signed)
Patient needs 30 day refill on thyroid meds called into [facility] on [address] in [place]  thanks

## 2013-11-07 NOTE — Telephone Encounter (Signed)
rx sent

## 2013-11-08 ENCOUNTER — Telehealth: Payer: Self-pay | Admitting: Family Medicine

## 2013-11-08 MED ORDER — FERROUS SULFATE 325 (65 FE) MG PO TBEC: 325.0000 mg | DELAYED_RELEASE_TABLET | Freq: Three times a day (TID) | ORAL | Status: AC

## 2013-11-08 NOTE — Telephone Encounter (Signed)
Sue Lushndrea, Will you please let patient know that her anemia does not appear to be due to any vitamin deficiency.  It's possible that an iron supplement will help increase her hemoglobin level.  I'd recommend taking an OTC Iron Sulfate supplement at a dose of 325mg  daily with a meal.

## 2013-11-08 NOTE — Telephone Encounter (Signed)
Left message on son's vm

## 2013-11-09 ENCOUNTER — Encounter: Payer: Self-pay | Admitting: Family Medicine

## 2013-12-12 ENCOUNTER — Ambulatory Visit (INDEPENDENT_AMBULATORY_CARE_PROVIDER_SITE_OTHER): Payer: Medicare Other | Admitting: Family Medicine

## 2013-12-12 ENCOUNTER — Encounter: Payer: Self-pay | Admitting: Family Medicine

## 2013-12-12 VITALS — BP 140/59 | HR 58 | Wt 137.0 lb

## 2013-12-12 DIAGNOSIS — T148XXA Other injury of unspecified body region, initial encounter: Secondary | ICD-10-CM

## 2013-12-12 DIAGNOSIS — IMO0002 Reserved for concepts with insufficient information to code with codable children: Secondary | ICD-10-CM

## 2013-12-12 DIAGNOSIS — L02419 Cutaneous abscess of limb, unspecified: Secondary | ICD-10-CM

## 2013-12-12 DIAGNOSIS — L03119 Cellulitis of unspecified part of limb: Secondary | ICD-10-CM

## 2013-12-12 DIAGNOSIS — L03115 Cellulitis of right lower limb: Secondary | ICD-10-CM

## 2013-12-12 MED ORDER — CEPHALEXIN 500 MG PO CAPS: 500.0000 mg | ORAL_CAPSULE | Freq: Three times a day (TID) | ORAL | Status: DC

## 2013-12-12 NOTE — Progress Notes (Signed)
CC: Natalie Hogan is a 80 y.o. female is here for right leg abrasion   Subjective: HPI:  Patient reports running into the edge of the stair on Saturday afternoon and within one hour presented to an emergency room where a skin flap was cleaned with  What sounds to be saline solution after hemostasis was provided with compression alone. The skin flap was then Steri-Stripped and she was discharged within a few hours. She tells me she has mild pain at the site of her wound but has not had any bleeding since leaving the emergency room. They have not felt the need to change the gauze since Saturday. She denies pain with weightbearing, an x-ray was obtained and reportedly was normal. She and her son are requesting further management. She denies fevers, chills, swollen lymph nodes, joint pain, discharge from the wound, flushing or nausea.   Review Of Systems Outlined In HPI  Past Medical History  Diagnosis Date  . Heart disease   . Hypertension   . Family history of thyroid problem   . Hypothyroidism   . Hyperlipidemia   . Peripheral vascular disease   . Anginal pain   . Exertional shortness of breath     "lately" (10/12/2012)  . Arthritis     "little bit in my knees" (10/12/2012)  . Gout   . Coronary artery disease   . Diverticulitis     Past Surgical History  Procedure Laterality Date  . Cesarean section  1941; 1949  . Appendectomy  ~ 1937  . Tubal ligation  1949  . Cataract extraction w/ intraocular lens  implant, bilateral Bilateral ~ 2006  . Angioplasty      "attempted to put a stent in my right leg ~ 8 yr ago; vessel was too small" (10/12/2012)   Family History  Problem Relation Age of Onset  . Hypertension Son     History   Social History  . Marital Status: Widowed    Spouse Name: N/A    Number of Children: N/A  . Years of Education: N/A   Occupational History  . Not on file.   Social History Main Topics  . Smoking status: Former Smoker -- 10 years    Types:  Cigarettes    Quit date: 05/20/1963  . Smokeless tobacco: Never Used  . Alcohol Use: No  . Drug Use: No  . Sexual Activity: No   Other Topics Concern  . Not on file   Social History Narrative  . No narrative on file     Objective: BP 140/59  Pulse 58  Wt 137 lb (62.143 kg)  General: Alert and Oriented, No Acute Distress HEENT: Pupils equal, round, reactive to light. Conjunctivae clear.  Moist mucous membranes pharynx unremarkable Lungs: Clear and comfortable work of breathing Cardiac: Regular rate and rhythm.  Extremities: 1+ nonpitting edema in both lower is from the shins distally.  Strong peripheral pulses.  Mental Status: No depression, anxiety, nor agitation. Skin: Warm and dry. She has a "checkmark" pattern to a skin tear involving her right shin running vertically slightly tender to the touch with scant bleeding only present with palpation of the wound itself. There is no discharge other than that.  Assessment & Plan: Natalie Hogan was seen today for right leg abrasion.  Diagnoses and associated orders for this visit:  Cellulitis of right lower extremity - cephALEXin (KEFLEX) 500 MG capsule; Take 1 capsule (500 mg total) by mouth 3 (three) times daily.  Skin tear - Ambulatory referral to  Wound Clinic    I am worried that she has a high probability of this wound becoming infected given her venous stasis therefore like her to start prophylactic Keflex until she can see wound therapy for further management. I kept all Steri-Strips covering the wound and on top of this placed iodoform gauze with then a nonadherent gauze and finally Coban. I instructed with the patient and her son how to do this at home and to change this every 24 hours after showering.  25 minutes spent face-to-face during visit today of which at least 50% was counseling or coordinating care regarding: 1. Cellulitis of right lower extremity   2. Skin tear       Return if symptoms worsen or fail to  improve.

## 2013-12-22 ENCOUNTER — Encounter: Payer: Self-pay | Admitting: Family Medicine

## 2013-12-22 DIAGNOSIS — IMO0002 Reserved for concepts with insufficient information to code with codable children: Secondary | ICD-10-CM | POA: Insufficient documentation

## 2014-02-16 ENCOUNTER — Ambulatory Visit (INDEPENDENT_AMBULATORY_CARE_PROVIDER_SITE_OTHER): Payer: Medicare Other | Admitting: Sports Medicine

## 2014-02-16 ENCOUNTER — Encounter: Payer: Self-pay | Admitting: Sports Medicine

## 2014-02-16 VITALS — BP 139/67 | HR 56 | Ht <= 58 in | Wt 136.0 lb

## 2014-02-16 DIAGNOSIS — M170 Bilateral primary osteoarthritis of knee: Secondary | ICD-10-CM

## 2014-02-16 DIAGNOSIS — Z23 Encounter for immunization: Secondary | ICD-10-CM

## 2014-02-16 NOTE — Progress Notes (Signed)
  Subjective:    CC: Knee pain  HPI: This is a very pleasant 80 year old female, 6 months ago I injected both of her knees, she had a fantastic response returns today with a recurrence of knee pain. It is moderate, persistent localized at the joint lines, and she does request repeat interventional treatment.  Past medical history, Surgical history, Family history not pertinant except as noted below, Social history, Allergies, and medications have been entered into the medical record, reviewed, and no changes needed.   Review of Systems: No fevers, chills, night sweats, weight loss, chest pain, or shortness of breath.   Objective:    General: Well Developed, well nourished, and in no acute distress.  Neuro: Alert and oriented x3, extra-ocular muscles intact, sensation grossly intact.  HEENT: Normocephalic, atraumatic, pupils equal round reactive to light, neck supple, no masses, no lymphadenopathy, thyroid nonpalpable.  Skin: Warm and dry, no rashes. Cardiac: Regular rate and rhythm, no murmurs rubs or gallops, no lower extremity edema.  Respiratory: Clear to auscultation bilaterally. Not using accessory muscles, speaking in full sentences.  Procedure: Real-time Ultrasound Guided Injection of right knee Device: GE Logiq E  Verbal informed consent obtained.  Time-out conducted.  Noted no overlying erythema, induration, or other signs of local infection.  Skin prepped in a sterile fashion.  Local anesthesia: Topical Ethyl chloride.  With sterile technique and under real time ultrasound guidance:  2 cc kenalog 40, 4 cc lidocaine injected easily into the suprapatellar recess. Completed without difficulty  Pain immediately resolved suggesting accurate placement of the medication.  Advised to call if fevers/chills, erythema, induration, drainage, or persistent bleeding.  Images permanently stored and available for review in the ultrasound unit.  Impression: Technically successful ultrasound  guided injection.  Procedure: Real-time Ultrasound Guided Injection of left knee Device: GE Logiq E  Verbal informed consent obtained.  Time-out conducted.  Noted no overlying erythema, induration, or other signs of local infection.  Skin prepped in a sterile fashion.  Local anesthesia: Topical Ethyl chloride.  With sterile technique and under real time ultrasound guidance: 18-gauge needle advanced into the effusion, 15 cc of straw-colored fluid was aspirated, syringe switched and 2 cc kenalog 40, 4 cc lidocaine injected easily into the suprapatellar recess. Completed without difficulty  Pain immediately resolved suggesting accurate placement of the medication.  Advised to call if fevers/chills, erythema, induration, drainage, or persistent bleeding.  Images permanently stored and available for review in the ultrasound unit.  Impression: Technically successful ultrasound guided injection.  Impression and Recommendations:

## 2014-02-16 NOTE — Assessment & Plan Note (Signed)
Bilateral injection as above. Return as needed. 

## 2014-02-23 ENCOUNTER — Ambulatory Visit (INDEPENDENT_AMBULATORY_CARE_PROVIDER_SITE_OTHER): Payer: Medicare Other | Admitting: Sports Medicine

## 2014-02-23 ENCOUNTER — Encounter: Payer: Self-pay | Admitting: Sports Medicine

## 2014-02-23 ENCOUNTER — Telehealth: Payer: Self-pay | Admitting: Family Medicine

## 2014-02-23 VITALS — BP 141/66 | HR 64 | Temp 98.0°F | Ht <= 58 in | Wt 135.0 lb

## 2014-02-23 DIAGNOSIS — I82402 Acute embolism and thrombosis of unspecified deep veins of left lower extremity: Secondary | ICD-10-CM

## 2014-02-23 DIAGNOSIS — I82412 Acute embolism and thrombosis of left femoral vein: Secondary | ICD-10-CM | POA: Insufficient documentation

## 2014-02-23 DIAGNOSIS — R21 Rash and other nonspecific skin eruption: Secondary | ICD-10-CM

## 2014-02-23 MED ORDER — AMBULATORY NON FORMULARY MEDICATION: Status: AC

## 2014-02-23 MED ORDER — PREDNISONE 50 MG PO TABS: 50.0000 mg | ORAL_TABLET | Freq: Every day | ORAL | Status: DC

## 2014-02-23 MED ORDER — DOXYCYCLINE HYCLATE 100 MG PO TABS: 100.0000 mg | ORAL_TABLET | Freq: Two times a day (BID) | ORAL | Status: DC

## 2014-02-23 NOTE — Progress Notes (Signed)
  Subjective:    CC: Left knee swelling  HPI: Natalie Hogan is a very pleasant 80 year old female, she has knee osteoarthritis, I drained and injected both knees approximately one week ago. Per patient, a day or so after the injection she developed an erythematous, highly pruritic, burning rash over the anterior aspect of her left knee. Right knee is okay. She denies any wraps or creams, no changes in diet. She has no fevers or chills, no constitutional symptoms.  Past medical history, Surgical history, Family history not pertinant except as noted below, Social history, Allergies, and medications have been entered into the medical record, reviewed, and no changes needed.   Review of Systems: No fevers, chills, night sweats, weight loss, chest pain, or shortness of breath.   Objective:    General: Well Developed, well nourished, and in no acute distress.  Neuro: Alert and oriented x3, extra-ocular muscles intact, sensation grossly intact.  HEENT: Normocephalic, atraumatic, pupils equal round reactive to light, neck supple, no masses, no lymphadenopathy, thyroid nonpalpable.  Skin: Warm and dry. Cardiac: Regular rate and rhythm, no murmurs rubs or gallops, no lower extremity edema.  Respiratory: Clear to auscultation bilaterally. Not using accessory muscles, speaking in full sentences. Left knee: No effusion, full range of motion without pain. There is a very well-defined, erythematous rash over the anterior knee, this appears to be a contact dermatitis of sorts. It is exquisitely tender to palpation and slightly warm, but not indurated.  Impression and Recommendations:

## 2014-02-23 NOTE — Telephone Encounter (Signed)
Patient request

## 2014-02-23 NOTE — Assessment & Plan Note (Addendum)
Over the left knee, very sharply demarcated suggestive of an allergic reaction, there is also significant pain and burning. Prednisone, doxycycline. She has good range of motion without pain so I have zero suspicion for septic joint. This was after injection a week ago. If rash is persistent at the next visit we will biopsy.

## 2014-03-02 ENCOUNTER — Encounter: Payer: Self-pay | Admitting: Sports Medicine

## 2014-03-02 ENCOUNTER — Ambulatory Visit: Payer: Medicare Other

## 2014-03-02 ENCOUNTER — Ambulatory Visit (INDEPENDENT_AMBULATORY_CARE_PROVIDER_SITE_OTHER): Payer: Medicare Other | Admitting: Sports Medicine

## 2014-03-02 ENCOUNTER — Telehealth: Payer: Self-pay | Admitting: *Deleted

## 2014-03-02 VITALS — BP 142/61 | HR 60 | Wt 134.0 lb

## 2014-03-02 DIAGNOSIS — I82412 Acute embolism and thrombosis of left femoral vein: Secondary | ICD-10-CM | POA: Diagnosis not present

## 2014-03-02 DIAGNOSIS — M7989 Other specified soft tissue disorders: Secondary | ICD-10-CM

## 2014-03-02 MED ORDER — RIVAROXABAN 20 MG PO TABS: 20.0000 mg | ORAL_TABLET | Freq: Every day | ORAL | Status: DC

## 2014-03-02 MED ORDER — DOXYCYCLINE HYCLATE 100 MG PO TABS: 100.0000 mg | ORAL_TABLET | Freq: Two times a day (BID) | ORAL | Status: DC

## 2014-03-02 NOTE — Progress Notes (Signed)
  Subjective:    CC: Followup  HPI: Natalie Hogan is a very pleasant 80 year old female, she has any osteoarthritis, we did an injection approximately 2 weeks ago, soon afterwards she developed redness and swelling in her left knee, this seemed to look like an allergic reaction. She did not have any pain when the knee was taken through the passive range of motion. She also denied any constitutional symptoms. I placed her on doxycycline and prednisone, and she returns today, the rash is starting to resolve. Unfortunately she continues to have increasing swelling in her left lower extremity, worse with walking and palpation, and persistent swelling and redness in her left knee. She continues to have no pain as the knee is taken through the range of motion.  Past medical history, Surgical history, Family history not pertinant except as noted below, Social history, Allergies, and medications have been entered into the medical record, reviewed, and no changes needed.   Review of Systems: No fevers, chills, night sweats, weight loss, chest pain, or shortness of breath.   Objective:    General: Well Developed, well nourished, and in no acute distress.  Neuro: Alert and oriented x3, extra-ocular muscles intact, sensation grossly intact.  HEENT: Normocephalic, atraumatic, pupils equal round reactive to light, neck supple, no masses, no lymphadenopathy, thyroid nonpalpable.  Skin: Warm and dry, no rashes. Cardiac: Regular rate and rhythm, no murmurs rubs or gallops, no lower extremity edema.  Respiratory: Clear to auscultation bilaterally. Not using accessory muscles, speaking in full sentences. Left knee: Tender to palpation over the patella anteriorly, only minimally red without a palpable fluid effusion, no warmth, full range of motion without pain. She does have swelling in the entire left lower leg worse than the right, with a positive Homans sign. There is 3+ pitting edema.  Impression and Recommendations:

## 2014-03-02 NOTE — Addendum Note (Signed)
Addended by: Monica BectonHEKKEKANDAM, Rees Santistevan J on: 03/02/2014 03:12 PM   Modules accepted: Orders, Medications

## 2014-03-02 NOTE — Telephone Encounter (Signed)
No prior auth required for MRI or venous doppler study. Corliss SkainsJamie Esbeydi Manago, CMA

## 2014-03-02 NOTE — Assessment & Plan Note (Addendum)
Continue doxycycline for an additional 7 days. Lower extremity Dopplers, MRI of the left knee today. Certainly with a recent injection for arthritis this could represent a septic arthritis however she has no pain with passive motion. The lower leg was strapped with compressive dressing.  Discontinue doxycycline, continue compression wrap, lower extremity Dopplers showed a femoral DVT. Cancel MRI. Starting Xarelto.

## 2014-03-03 ENCOUNTER — Other Ambulatory Visit: Payer: Self-pay | Admitting: *Deleted

## 2014-03-03 MED ORDER — LEVOTHYROXINE SODIUM 75 MCG PO TABS: 75.0000 ug | ORAL_TABLET | Freq: Every day | ORAL | Status: DC

## 2014-03-03 MED ORDER — ISOSORBIDE MONONITRATE ER 30 MG PO TB24: 30.0000 mg | ORAL_TABLET | Freq: Every day | ORAL | Status: DC

## 2014-03-10 ENCOUNTER — Ambulatory Visit (INDEPENDENT_AMBULATORY_CARE_PROVIDER_SITE_OTHER): Payer: Medicare Other | Admitting: Sports Medicine

## 2014-03-10 ENCOUNTER — Encounter: Payer: Self-pay | Admitting: Sports Medicine

## 2014-03-10 ENCOUNTER — Telehealth: Payer: Self-pay | Admitting: *Deleted

## 2014-03-10 VITALS — BP 133/66 | HR 61 | Wt 137.0 lb

## 2014-03-10 DIAGNOSIS — I2699 Other pulmonary embolism without acute cor pulmonale: Secondary | ICD-10-CM

## 2014-03-10 DIAGNOSIS — E039 Hypothyroidism, unspecified: Secondary | ICD-10-CM

## 2014-03-10 DIAGNOSIS — I82412 Acute embolism and thrombosis of left femoral vein: Secondary | ICD-10-CM

## 2014-03-10 MED ORDER — RIVAROXABAN 20 MG PO TABS: 20.0000 mg | ORAL_TABLET | Freq: Every day | ORAL | Status: DC

## 2014-03-10 NOTE — Telephone Encounter (Signed)
labs

## 2014-03-10 NOTE — Assessment & Plan Note (Addendum)
Wrapped lower extremity with compression dressing, she will wear lower extremity compression stockings. Refilling Xarelto, she will to 20 mg daily after finishing her first 21 days and 15 mg twice a day. Patient declines history previous blood clots however I do see that she had a pulmonary embolism in 2014. If this is the case anticoagulation will need to be lifelong. Deferral to PCP. She should continue lower extremity compression for post thrombotic phlebitis.

## 2014-03-10 NOTE — Assessment & Plan Note (Addendum)
Patient denies, this, if true, anticoag will have to be lifelong. I am unable to find any record of this. Deferral to PCP.

## 2014-03-10 NOTE — Progress Notes (Signed)
  Subjective:    CC: Followup  HPI: This is a pleasant 80 year old female, at the last visit we diagnosed a left femoral vein deep vein thrombosis and started Xarelto. We have been unclear as to whether she had a pulmonary embolism in the past however it is in her chart. She is doing well with Xarelto, no chest pain, shortness of breath, leg swelling has remained but she is doing good job keeping it compressed and elevated.  Past medical history, Surgical history, Family history not pertinant except as noted below, Social history, Allergies, and medications have been entered into the medical record, reviewed, and no changes needed.   Review of Systems: No fevers, chills, night sweats, weight loss, chest pain, or shortness of breath.   Objective:    General: Well Developed, well nourished, and in no acute distress.  Neuro: Alert and oriented x3, extra-ocular muscles intact, sensation grossly intact.  HEENT: Normocephalic, atraumatic, pupils equal round reactive to light, neck supple, no masses, no lymphadenopathy, thyroid nonpalpable.  Skin: Warm and dry, no rashes. Cardiac: Regular rate and rhythm, no murmurs rubs or gallops, no lower extremity edema.  Respiratory: Clear to auscultation bilaterally. Not using accessory muscles, speaking in full sentences. Left leg: Swollen, only mildly erythematous, good pulses.  Strapped with compressive dressing from the foot to the knee.  Impression and Recommendations:

## 2014-03-11 LAB — TSH: TSH: 3.886 u[IU]/mL (ref 0.350–4.500)

## 2014-03-13 ENCOUNTER — Telehealth: Payer: Self-pay | Admitting: Family Medicine

## 2014-03-13 MED ORDER — LEVOTHYROXINE SODIUM 75 MCG PO TABS: 75.0000 ug | ORAL_TABLET | Freq: Every day | ORAL | Status: DC

## 2014-03-13 NOTE — Telephone Encounter (Signed)
Sue Lushndrea, Will you please let patient/son know that her thyroid supplementation appears to be adaquate.  Continue with current regimen of levothyroxine.  [facility].

## 2014-03-13 NOTE — Telephone Encounter (Signed)
Pt.notified

## 2014-03-15 ENCOUNTER — Telehealth: Payer: Self-pay | Admitting: *Deleted

## 2014-03-15 MED ORDER — LEVOTHYROXINE SODIUM 75 MCG PO TABS: 75.0000 ug | ORAL_TABLET | Freq: Every day | ORAL | Status: DC

## 2014-03-15 NOTE — Telephone Encounter (Signed)
Son called and left a message that he wants pt's thyroid medication to be sent to the [facility].fax # to [facility] is [phone]

## 2014-03-21 ENCOUNTER — Other Ambulatory Visit: Payer: Self-pay

## 2014-03-21 DIAGNOSIS — I82412 Acute embolism and thrombosis of left femoral vein: Secondary | ICD-10-CM

## 2014-03-21 MED ORDER — RIVAROXABAN 20 MG PO TABS: 20.0000 mg | ORAL_TABLET | Freq: Every day | ORAL | Status: DC

## 2014-03-22 ENCOUNTER — Telehealth: Payer: Self-pay

## 2014-03-22 MED ORDER — NITROGLYCERIN 0.4 MG/SPRAY TL SOLN: 1.0000 | Status: DC | PRN

## 2014-03-22 NOTE — Telephone Encounter (Signed)
New refills have been [facility] to [facility]

## 2014-03-22 NOTE — Telephone Encounter (Signed)
Patient son called stated request a refill for nitroglycerin Spray 0.4 mg sent to [facility].  Patient was on this medication in the past but it was discontinued.Rhonda Cunningham,CMA

## 2014-03-23 NOTE — Telephone Encounter (Signed)
Patient informed. Rhonda Cunningham,CMA  

## 2014-04-07 ENCOUNTER — Other Ambulatory Visit: Payer: Self-pay

## 2014-04-07 ENCOUNTER — Telehealth: Payer: Self-pay

## 2014-04-07 DIAGNOSIS — I82412 Acute embolism and thrombosis of left femoral vein: Secondary | ICD-10-CM

## 2014-04-07 MED ORDER — RIVAROXABAN 20 MG PO TABS: 20.0000 mg | ORAL_TABLET | Freq: Every day | ORAL | Status: DC

## 2014-04-07 NOTE — Telephone Encounter (Signed)
error 

## 2014-04-07 NOTE — Telephone Encounter (Signed)
Antoniette's son would like a prescription for Xarelto faxed to the [facility] [phone].

## 2014-04-24 ENCOUNTER — Ambulatory Visit (INDEPENDENT_AMBULATORY_CARE_PROVIDER_SITE_OTHER): Payer: Medicare Other | Admitting: Family Medicine

## 2014-04-24 ENCOUNTER — Encounter: Payer: Self-pay | Admitting: Family Medicine

## 2014-04-24 VITALS — BP 99/62 | HR 58 | Wt 134.0 lb

## 2014-04-24 DIAGNOSIS — I10 Essential (primary) hypertension: Secondary | ICD-10-CM

## 2014-04-24 DIAGNOSIS — L03211 Cellulitis of face: Secondary | ICD-10-CM

## 2014-04-24 DIAGNOSIS — I509 Heart failure, unspecified: Secondary | ICD-10-CM

## 2014-04-24 DIAGNOSIS — I351 Nonrheumatic aortic (valve) insufficiency: Secondary | ICD-10-CM

## 2014-04-24 MED ORDER — CLINDAMYCIN HCL 300 MG PO CAPS: 300.0000 mg | ORAL_CAPSULE | Freq: Three times a day (TID) | ORAL | Status: DC

## 2014-04-24 MED ORDER — NITROGLYCERIN 0.4 MG/SPRAY TL SOLN
1.0000 | Status: AC | PRN
Start: 2014-04-24 — End: ?

## 2014-04-24 NOTE — Patient Instructions (Signed)
Call me next week if you've not been contacted about scheduling an echocardiogram through our [facility].

## 2014-04-24 NOTE — Progress Notes (Signed)
CC: Natalie Hogan is a 80 y.o. female is here for No chief complaint on file.   Subjective: HPI:  Hospital follow-up: Saturday following Thanksgiving she was having orthopnea and shortness of breath with any activity. Oxygen saturation was in the 80s when she showed up at a local emergency room. She was found to have pulmonary edema and within 2 days of diuresis she stated she was back to her normal energetic self without any shortness of breath or pulmonary complaints.  The only adjustments to her medications include increasing Lasix to  20 mg twice a day. She's been doing this  Without any lightheadedness or known side effects. She is weighing herself on a daily basis and notices that it fluctuates plus or minus 2 pounds.   Since discharge she denies any shortness of breath orthopnea nor new peripheral edema.   She states that she does not add salt to her diet,  She is uncertain what exactly she was eating around the time of Thanksgiving or the day prior to her hospitalization.  She denies any pulmonary complaints currently   her only complaint today is swelling on the right temple that has been present for the past 2 days. It came on suddenly without any known trauma. It is tender to the touch or when lying on the right  Face. No interventions as of yet. Seems to be growing on daily basis. Denies fevers, chills, nor skin changes elsewhere  Review Of Systems Outlined In HPI  Past Medical History  Diagnosis Date  . Heart disease   . Hypertension   . Family history of thyroid problem   . Hypothyroidism   . Hyperlipidemia   . Peripheral vascular disease   . Anginal pain   . Exertional shortness of breath     "lately" (10/12/2012)  . Arthritis     "little bit in my knees" (10/12/2012)  . Gout   . Coronary artery disease   . Diverticulitis     Past Surgical History  Procedure Laterality Date  . Cesarean section  1941; 1949  . Appendectomy  ~ 1937  . Tubal ligation  1949  . Cataract  extraction w/ intraocular lens  implant, bilateral Bilateral ~ 2006  . Angioplasty      "attempted to put a stent in my right leg ~ 8 yr ago; vessel was too small" (10/12/2012)   Family History  Problem Relation Age of Onset  . Hypertension Son     History   Social History  . Marital Status: Widowed    Spouse Name: N/A    Number of Children: N/A  . Years of Education: N/A   Occupational History  . Not on file.   Social History Main Topics  . Smoking status: Former Smoker -- 10 years    Types: Cigarettes    Quit date: 05/20/1963  . Smokeless tobacco: Never Used  . Alcohol Use: No  . Drug Use: No  . Sexual Activity: No   Other Topics Concern  . Not on file   Social History Narrative     Objective: BP 99/62 mmHg  Pulse 58  Wt 134 lb (60.782 kg)  SpO2 96%  General: Alert and Oriented, No Acute Distress HEENT: Pupils equal, round, reactive to light. Conjunctivae clear.   Moist mucousmembranes pharynx unremarkable Lungs: Clear to auscultation bilaterally, no wheezing/ronchi/rales.  Comfortable work of breathing. Good air movement. Cardiac: Regular rate and rhythm. Normal S1/S2.  No murmurs, rubs, nor gallops.   Extremities: No peripheral  edema.  Strong peripheral pulses.  Mental Status: No depression, anxiety, nor agitation. Skin: Warm and dry.  1 cm dome-shaped swelling with central  Pustule  Tender to the touch  Overlying the right temple  Assessment & Plan: Natalie Hogan was seen today for no specified reason.  Diagnoses and associated orders for this visit:  Essential hypertension, benign - 2D Echocardiogram without contrast; Future  Mild aortic regurgitation (ECHO 08/2012 Scanned) - 2D Echocardiogram without contrast; Future  Cellulitis of face - clindamycin (CLEOCIN) 300 MG capsule; Take 1 capsule (300 mg total) by mouth 3 (three) times daily.  Congestive heart failure, unspecified congestive heart failure chronicity, unspecified congestive heart failure  type  Other Orders - nitroGLYCERIN (NITROLINGUAL) 0.4 MG/SPRAY spray; Place 1 spray under the tongue every 5 (five) minutes x 3 doses as needed for chest pain.     Congestive heart failure:  I suspect that she  Unintentionally increased her sodium intake that caused this exacerbation, she will continue on 20 mg of Lasix twice a day.  I'm hesitant to start lisinopril given her renal insufficiency along with already low blood pressure. Obtaining an echocardiogram to look for  Any new structural abnormalities or worsening of her aortic regurgitation.  Continue metoprolol  Cellulitis of the face: Start clindamycin and call if no better after 48 hours of antibiotic   she is requesting a refill of sublingual nitroglycerin spray  That she's never had to use for chest pain.   Return in about 4 weeks (around 05/22/2014).

## 2014-05-03 ENCOUNTER — Ambulatory Visit (HOSPITAL_BASED_OUTPATIENT_CLINIC_OR_DEPARTMENT_OTHER)
Admission: RE | Admit: 2014-05-03 | Discharge: 2014-05-03 | Disposition: A | Discharge status: Home / Self Care | Payer: Medicare Other | Source: Ambulatory Visit | Attending: Family Medicine | Admitting: Family Medicine

## 2014-05-03 DIAGNOSIS — R600 Localized edema: Secondary | ICD-10-CM | POA: Diagnosis not present

## 2014-05-03 DIAGNOSIS — Z8249 Family history of ischemic heart disease and other diseases of the circulatory system: Secondary | ICD-10-CM | POA: Insufficient documentation

## 2014-05-03 DIAGNOSIS — I10 Essential (primary) hypertension: Secondary | ICD-10-CM | POA: Diagnosis present

## 2014-05-03 DIAGNOSIS — R0600 Dyspnea, unspecified: Secondary | ICD-10-CM | POA: Insufficient documentation

## 2014-05-03 DIAGNOSIS — I059 Rheumatic mitral valve disease, unspecified: Secondary | ICD-10-CM

## 2014-05-03 DIAGNOSIS — I340 Nonrheumatic mitral (valve) insufficiency: Secondary | ICD-10-CM | POA: Diagnosis not present

## 2014-05-03 DIAGNOSIS — I351 Nonrheumatic aortic (valve) insufficiency: Secondary | ICD-10-CM | POA: Diagnosis not present

## 2014-05-03 NOTE — Progress Notes (Signed)
  Echocardiogram 2D Echocardiogram has been performed.  Jarred Purtee 05/03/2014, 1:17 PM

## 2014-05-04 ENCOUNTER — Ambulatory Visit (INDEPENDENT_AMBULATORY_CARE_PROVIDER_SITE_OTHER): Payer: Medicare Other | Admitting: Sports Medicine

## 2014-05-04 ENCOUNTER — Ambulatory Visit (INDEPENDENT_AMBULATORY_CARE_PROVIDER_SITE_OTHER): Payer: Medicare Other

## 2014-05-04 VITALS — BP 135/65 | HR 57 | Temp 97.3°F | Ht <= 58 in | Wt 134.0 lb

## 2014-05-04 DIAGNOSIS — R938 Abnormal findings on diagnostic imaging of other specified body structures: Secondary | ICD-10-CM

## 2014-05-04 DIAGNOSIS — E034 Atrophy of thyroid (acquired): Secondary | ICD-10-CM

## 2014-05-04 DIAGNOSIS — E038 Other specified hypothyroidism: Secondary | ICD-10-CM

## 2014-05-04 DIAGNOSIS — M79645 Pain in left finger(s): Secondary | ICD-10-CM

## 2014-05-04 DIAGNOSIS — M1A0421 Idiopathic chronic gout, left hand, with tophus (tophi): Secondary | ICD-10-CM

## 2014-05-04 NOTE — Assessment & Plan Note (Addendum)
This is likely an acute flare of gout. She does have what appears to be a tophus on x-ray. Checking uric acid levels. Proximal interphalangeal joint injection as above.  Return in one month. If uric acid levels are elevated, and renal function appropriate we will start uric acid lowering medication. Continue colchicine for now.

## 2014-05-04 NOTE — Progress Notes (Signed)
  Subjective:    CC: Finger pain  HPI: This is exquisitely pleasant 80 year old female who comes in with a two-week history of pain and swelling over the proximal interphalangeal joint of her left fifth finger. She was recently in the hospital and diuresis after having anasarca and a pleural effusion. She then developed fusiform swelling at the left fifth PIP. Pain is moderate, persistent, no trauma. No constitutional symptoms.  Past medical history, Surgical history, Family history not pertinant except as noted below, Social history, Allergies, and medications have been entered into the medical record, reviewed, and no changes needed.   Review of Systems: No fevers, chills, night sweats, weight loss, chest pain, or shortness of breath.   Objective:    General: Well Developed, well nourished, and in no acute distress.  Neuro: Alert and oriented x3, extra-ocular muscles intact, sensation grossly intact.  HEENT: Normocephalic, atraumatic, pupils equal round reactive to light, neck supple, no masses, no lymphadenopathy, thyroid nonpalpable.  Skin: Warm and dry, no rashes. Cardiac: Regular rate and rhythm, no murmurs rubs or gallops, no lower extremity edema.  Respiratory: Clear to auscultation bilaterally. Not using accessory muscles, speaking in full sentences.  Left hand: There is fusiform swelling over the proximal interphalangeal joint of the left fifth digit, there is mild erythema, there is no warmth or induration.  X-rays personally reviewed and show significant osteopenia with what appears to be marginal erosions and a tophus.  Procedure: Real-time Ultrasound Guided Injection of left fifth proximal interphalangeal joint Device: GE Logiq E  Verbal informed consent obtained.  Time-out conducted.  Noted no overlying erythema, induration, or other signs of local infection.  Skin prepped in a sterile fashion.  Local anesthesia: Topical Ethyl chloride.  With sterile technique and under  real time ultrasound guidance:  0.5 mL kenalog 40, 0.5 mL lidocaine injected easily. Completed without difficulty  Pain immediately resolved suggesting accurate placement of the medication.  Advised to call if fevers/chills, erythema, induration, drainage, or persistent bleeding.  Images permanently stored and available for review in the ultrasound unit.  Impression: Technically successful ultrasound guided injection.  Impression and Recommendations:

## 2014-05-05 ENCOUNTER — Other Ambulatory Visit: Payer: Self-pay | Admitting: Sports Medicine

## 2014-05-05 DIAGNOSIS — E034 Atrophy of thyroid (acquired): Secondary | ICD-10-CM

## 2014-05-05 LAB — BASIC METABOLIC PANEL WITH GFR
BUN: 40 mg/dL — ABNORMAL HIGH (ref 6–23)
Calcium: 9.3 mg/dL (ref 8.4–10.5)
Creat: 1.46 mg/dL — ABNORMAL HIGH (ref 0.50–1.10)

## 2014-05-05 LAB — BASIC METABOLIC PANEL
CO2: 26 mEq/L (ref 19–32)
Chloride: 99 mEq/L (ref 96–112)
Glucose, Bld: 65 mg/dL — ABNORMAL LOW (ref 70–99)
Potassium: 4.8 mEq/L (ref 3.5–5.3)
Sodium: 135 mEq/L (ref 135–145)

## 2014-05-05 LAB — URIC ACID: Uric Acid, Serum: 13.6 mg/dL — ABNORMAL HIGH (ref 2.4–7.0)

## 2014-05-05 MED ORDER — ALLOPURINOL 300 MG PO TABS: 300.0000 mg | ORAL_TABLET | Freq: Every day | ORAL | Status: DC

## 2014-05-05 MED ORDER — LEVOTHYROXINE SODIUM 88 MCG PO TABS: 88.0000 ug | ORAL_TABLET | Freq: Every day | ORAL | Status: DC

## 2014-05-05 NOTE — Assessment & Plan Note (Signed)
Uric acid levels in the 13's. Adding allopurinol 300 mg daily, this is the highest dose I would go to considering her renal function. Recheck uric acid in one month.

## 2014-05-05 NOTE — Addendum Note (Signed)
Addended by: Monica BectonHEKKEKANDAM, THOMAS J on: 05/05/2014 09:07 AM   Modules accepted: Orders

## 2014-05-05 NOTE — Assessment & Plan Note (Signed)
Increasing levothyroxine to 88 g due to elevated TSH. May follow this up with PCP

## 2014-05-30 ENCOUNTER — Other Ambulatory Visit: Payer: Self-pay | Admitting: *Deleted

## 2014-05-30 DIAGNOSIS — I10 Essential (primary) hypertension: Secondary | ICD-10-CM

## 2014-05-30 DIAGNOSIS — E034 Atrophy of thyroid (acquired): Secondary | ICD-10-CM

## 2014-05-30 DIAGNOSIS — I82412 Acute embolism and thrombosis of left femoral vein: Secondary | ICD-10-CM

## 2014-05-30 MED ORDER — COLCHICINE 0.6 MG PO TABS: 0.6000 mg | ORAL_TABLET | Freq: Every day | ORAL | Status: AC

## 2014-05-30 MED ORDER — METOPROLOL TARTRATE 100 MG PO TABS: 200.0000 mg | ORAL_TABLET | Freq: Two times a day (BID) | ORAL | Status: AC

## 2014-05-30 MED ORDER — ISOSORBIDE MONONITRATE ER 30 MG PO TB24: 30.0000 mg | ORAL_TABLET | Freq: Every day | ORAL | Status: AC

## 2014-05-30 MED ORDER — RIVAROXABAN 20 MG PO TABS: 20.0000 mg | ORAL_TABLET | Freq: Every day | ORAL | Status: DC

## 2014-05-30 MED ORDER — AMLODIPINE BESYLATE 5 MG PO TABS: 5.0000 mg | ORAL_TABLET | Freq: Every day | ORAL | Status: AC

## 2014-05-30 MED ORDER — LEVOTHYROXINE SODIUM 88 MCG PO TABS: 88.0000 ug | ORAL_TABLET | Freq: Every day | ORAL | Status: AC

## 2014-05-30 MED ORDER — FUROSEMIDE 20 MG PO TABS: 20.0000 mg | ORAL_TABLET | Freq: Every day | ORAL | Status: DC

## 2014-06-15 ENCOUNTER — Ambulatory Visit (INDEPENDENT_AMBULATORY_CARE_PROVIDER_SITE_OTHER): Payer: Medicare Other | Admitting: Sports Medicine

## 2014-06-15 ENCOUNTER — Encounter: Payer: Self-pay | Admitting: Sports Medicine

## 2014-06-15 VITALS — BP 142/68 | HR 67 | Ht <= 58 in | Wt 135.0 lb

## 2014-06-15 DIAGNOSIS — M1A0421 Idiopathic chronic gout, left hand, with tophus (tophi): Secondary | ICD-10-CM

## 2014-06-15 DIAGNOSIS — E038 Other specified hypothyroidism: Secondary | ICD-10-CM

## 2014-06-15 DIAGNOSIS — R600 Localized edema: Secondary | ICD-10-CM

## 2014-06-15 DIAGNOSIS — E034 Atrophy of thyroid (acquired): Secondary | ICD-10-CM

## 2014-06-15 MED ORDER — DICLOFENAC SODIUM 2 % TD SOLN: 2.0000 | Freq: Two times a day (BID) | TRANSDERMAL | Status: AC

## 2014-06-15 NOTE — Progress Notes (Signed)
  Subjective:    CC: Follow-up  HPI: Gout: Natalie Hogan's uric acid levels came back greater than 13, we also injected her left fifth interphalangeal joint at the last visit. She tells me that her pain has resolved, for the most part she still does have some discomfort in the finger. She has not had any further flares.  Lower extremity edema: Resolved with compression stockings.  Past medical history, Surgical history, Family history not pertinant except as noted below, Social history, Allergies, and medications have been entered into the medical record, reviewed, and no changes needed.   Review of Systems: No fevers, chills, night sweats, weight loss, chest pain, or shortness of breath.   Objective:    General: Well Developed, well nourished, and in no acute distress.  Neuro: Alert and oriented x3, extra-ocular muscles intact, sensation grossly intact.  HEENT: Normocephalic, atraumatic, pupils equal round reactive to light, neck supple, no masses, no lymphadenopathy, thyroid nonpalpable.  Skin: Warm and dry, no rashes. Cardiac: Regular rate and rhythm, no murmurs rubs or gallops, no lower extremity edema.  Respiratory: Clear to auscultation bilaterally. Not using accessory muscles, speaking in full sentences. Left hand: Fifth digit does have some tophi over the dorsum, she is unable to fully extend. There is minimal tenderness to palpation of the proximal interphalangeal joint but no swelling or erythema.  Impression and Recommendations:

## 2014-06-15 NOTE — Assessment & Plan Note (Addendum)
Overall doing better, no further flares since starting allopurinol and after interphalangeal joint injection. Finger swelling has resolved. She does have some tophi visible. Rechecking uric acid levels, and I am going to add topical diclofenac for symptom relief.  She does also have a wide spread osteoarthritis. If uric acid levels have normalized they will need a refill on allopurinol.

## 2014-06-15 NOTE — Assessment & Plan Note (Signed)
Resolved with compression stockings.

## 2014-06-16 ENCOUNTER — Telehealth: Payer: Self-pay

## 2014-06-16 DIAGNOSIS — M1A0421 Idiopathic chronic gout, left hand, with tophus (tophi): Secondary | ICD-10-CM

## 2014-06-16 LAB — BASIC METABOLIC PANEL WITH GFR
BUN: 35 mg/dL — ABNORMAL HIGH (ref 6–23)
Creat: 1.43 mg/dL — ABNORMAL HIGH (ref 0.50–1.10)
Sodium: 137 meq/L (ref 135–145)

## 2014-06-16 LAB — BASIC METABOLIC PANEL
CO2: 25 mEq/L (ref 19–32)
Calcium: 9.1 mg/dL (ref 8.4–10.5)
Chloride: 99 mEq/L (ref 96–112)
Glucose, Bld: 92 mg/dL (ref 70–99)
Potassium: 4.1 mEq/L (ref 3.5–5.3)

## 2014-06-16 LAB — TSH: TSH: 2.512 u[IU]/mL (ref 0.350–4.500)

## 2014-06-16 LAB — URIC ACID: Uric Acid, Serum: 4.7 mg/dL (ref 2.4–7.0)

## 2014-06-16 MED ORDER — ALLOPURINOL 300 MG PO TABS: 300.0000 mg | ORAL_TABLET | Freq: Every day | ORAL | Status: AC

## 2014-06-16 NOTE — Telephone Encounter (Signed)
Patient son request a refill for Allipurinol # [id number] [facility]. Rhonda Cunningham,CMA

## 2014-06-30 ENCOUNTER — Other Ambulatory Visit: Payer: Self-pay | Admitting: *Deleted

## 2014-06-30 MED ORDER — FUROSEMIDE 20 MG PO TABS: 20.0000 mg | ORAL_TABLET | Freq: Every day | ORAL | Status: DC

## 2014-07-13 ENCOUNTER — Encounter: Payer: Self-pay | Admitting: Family Medicine

## 2014-07-13 ENCOUNTER — Ambulatory Visit (INDEPENDENT_AMBULATORY_CARE_PROVIDER_SITE_OTHER): Payer: Medicare Other | Admitting: Family Medicine

## 2014-07-13 VITALS — BP 115/67 | HR 61 | Wt 126.0 lb

## 2014-07-13 DIAGNOSIS — I5033 Acute on chronic diastolic (congestive) heart failure: Secondary | ICD-10-CM

## 2014-07-13 DIAGNOSIS — D649 Anemia, unspecified: Secondary | ICD-10-CM

## 2014-07-13 DIAGNOSIS — M25511 Pain in right shoulder: Secondary | ICD-10-CM

## 2014-07-13 DIAGNOSIS — N289 Disorder of kidney and ureter, unspecified: Secondary | ICD-10-CM

## 2014-07-13 LAB — CBC
HCT: 32.3 % — ABNORMAL LOW (ref 36.0–46.0)
Hemoglobin: 10.4 g/dL — ABNORMAL LOW (ref 12.0–15.0)
MCH: 31.3 pg (ref 26.0–34.0)
MCHC: 32.2 g/dL (ref 30.0–36.0)
MCV: 97.3 fL (ref 78.0–100.0)
MPV: 9.4 fL (ref 8.6–12.4)
PLATELETS: 190 10*3/uL (ref 150–400)
RBC: 3.32 MIL/uL — AB (ref 3.87–5.11)
RDW: 17.4 % — AB (ref 11.5–15.5)
WBC: 5.5 10*3/uL (ref 4.0–10.5)

## 2014-07-13 LAB — BASIC METABOLIC PANEL WITH GFR
BUN: 41 mg/dL — ABNORMAL HIGH (ref 6–23)
CO2: 22 meq/L (ref 19–32)
Calcium: 8.7 mg/dL (ref 8.4–10.5)
Chloride: 104 mEq/L (ref 96–112)
Creat: 1.53 mg/dL — ABNORMAL HIGH (ref 0.50–1.10)
GFR, Est African American: 33 mL/min — ABNORMAL LOW
GFR, Est Non African American: 29 mL/min — ABNORMAL LOW
GLUCOSE: 97 mg/dL (ref 70–99)
POTASSIUM: 4.7 meq/L (ref 3.5–5.3)
SODIUM: 139 meq/L (ref 135–145)

## 2014-07-13 MED ORDER — PREDNISONE 20 MG PO TABS: ORAL_TABLET | ORAL | Status: AC

## 2014-07-13 MED ORDER — PREDNISONE 20 MG PO TABS: ORAL_TABLET | ORAL | Status: DC

## 2014-07-13 MED ORDER — FUROSEMIDE 20 MG PO TABS: 40.0000 mg | ORAL_TABLET | Freq: Every day | ORAL | Status: AC

## 2014-07-13 MED ORDER — POTASSIUM CHLORIDE CRYS ER 20 MEQ PO TBCR: 20.0000 meq | EXTENDED_RELEASE_TABLET | Freq: Every day | ORAL | Status: AC

## 2014-07-13 MED ORDER — APIXABAN 2.5 MG PO TABS: 2.5000 mg | ORAL_TABLET | Freq: Two times a day (BID) | ORAL | Status: AC

## 2014-07-13 NOTE — Progress Notes (Signed)
CC: Natalie Hogan is a 81 y.o. female is here for hospital f/u   Subjective: HPI:  Follow-up acute on chronic diastolic congestive heart failure with admission between February 14 through February 18.  She presented the emergency room on the 14th after 2 days of progressively worsening shortness of breath. On the day of admission her oxygen saturation was 89% on room air. Chest x-ray showed vascular congestion she was admitted for diuresis. She received Lasix IV which resulted in a creatinine bump to 1.8 with a discharge creatinine of 1.76. She tells me that by the day of discharge she felt like she was back in her regular state of health and was looking forward to going home. Significant changes to medication include Lasix increase to 40 mg daily. She's been wearing compression stockings during all waking hours at home. She continues on a beta blocker, metoprolol. She was discharged with Plavix however has never had a heart attack nor stroke. She denies any change in her diet or physical activity regimen on the weeks prior to her hospitalization.  On the day of admission she had a hemoglobin level of 9.7 which remained steady to 9.5 on day of discharge. She denies any shortness of breath today or since time of discharge  She commenced of right shoulder pain that has been present for 2 weeks now. It is described as deep in the joint and on the anterior surface of the joint. It is worse with "any movement" and better with resting it. It improves with hydrocodone she was given in the hospital. It also is improved with warm heat applications. She denies any recent falls trauma or overexertion. Denies any motor or sensory disturbance in the upper family other than the pain that radiates down the entire arm into the fingers. Denies any overlying skin changes swelling or warmth.   Review Of Systems Outlined In HPI  Past Medical History  Diagnosis Date  . Heart disease   . Hypertension   . Family  history of thyroid problem   . Hypothyroidism   . Hyperlipidemia   . Peripheral vascular disease   . Anginal pain   . Exertional shortness of breath     "lately" (10/12/2012)  . Arthritis     "little bit in my knees" (10/12/2012)  . Gout   . Coronary artery disease   . Diverticulitis     Past Surgical History  Procedure Laterality Date  . Cesarean section  1941; 1949  . Appendectomy  ~ 1937  . Tubal ligation  1949  . Cataract extraction w/ intraocular lens  implant, bilateral Bilateral ~ 2006  . Angioplasty      "attempted to put a stent in my right leg ~ 8 yr ago; vessel was too small" (10/12/2012)   Family History  Problem Relation Age of Onset  . Hypertension Son     History   Social History  . Marital Status: Widowed    Spouse Name: N/A  . Number of Children: N/A  . Years of Education: N/A   Occupational History  . Not on file.   Social History Main Topics  . Smoking status: Former Smoker -- 10 years    Types: Cigarettes    Quit date: 05/20/1963  . Smokeless tobacco: Never Used  . Alcohol Use: No  . Drug Use: No  . Sexual Activity: No   Other Topics Concern  . Not on file   Social History Narrative     Objective: BP 115/67 mmHg  Pulse 61  Wt 126 lb (57.153 kg)  SpO2 95%  General: Alert and Oriented, No Acute Distress HEENT: Pupils equal, round, reactive to light. Conjunctivae clear.  Membranes pharynx unremarkable Lungs: Clear to auscultation bilaterally, no wheezing/ronchi/rales.  Comfortable work of breathing. Good air movement. Cardiac: Regular rate and rhythm. Normal S1/S2.  No murmurs, rubs, nor gallops.   Abdomen: Normal bowel sounds, soft and non tender without palpable masses. Right shoulder exam reveals full strength in individual rotator cuff testing. No overlying redness warmth or swelling.  Neer's test positive.  Hawkins test positive. Empty can positive. Crossarm test positive. O'Brien's test positive. Apprehension test unwilling to  participate. Speed's test negative.no pain with palpation of the acromioclavicular joint Extremities: No peripheral edema.  Strong peripheral pulses.  Mental Status: No depression, anxiety, nor agitation. Skin: Warm and dry.  Assessment & Plan: Natalie Hogan was seen today for hospital f/u.  Diagnoses and all orders for this visit:  Anemia, unspecified anemia type Orders: -     CBC  Acute renal insufficiency Orders: -     BASIC METABOLIC PANEL WITH GFR  Acute on chronic diastolic heart failure  Right shoulder pain Orders: -     Discontinue: predniSONE (DELTASONE) 20 MG tablet; Three tabs at once daily for five days. -     predniSONE (DELTASONE) 20 MG tablet; Three tabs at once daily for five days.  Other orders -     apixaban (ELIQUIS) 2.5 MG TABS tablet; Take 1 tablet (2.5 mg total) by mouth 2 (two) times daily. -     furosemide (LASIX) 20 MG tablet; Take 2 tablets (40 mg total) by mouth daily. -     potassium chloride SA (K-DUR,KLOR-CON) 20 MEQ tablet; Take 1 tablet (20 mEq total) by mouth daily.   Anemia: Checking CBC a week after discharge Acute renal insufficiency: Likely due to diuresis, checking creatinine level today now that she is on stable 40 mg of Lasix daily Right shoulder pain: Exam leads differential still quite broad. Since I don't think that a impingement syndrome or rotator cuff tendinitis is the sole cause of her pain will try prednisone burst instead of injection Acute on chronic diastolic heart failure: Resolved and stable continue Lasix 40 mg daily pending results of renal function. Agree with beginning daily potassium supplement pending results from BMP today  Anticoagulation was changed from xarelto to eliquis BID, I'm in agreement given her renal function.  Return in about 3 months (around 10/11/2014).

## 2014-07-19 ENCOUNTER — Telehealth: Payer: Self-pay | Admitting: *Deleted

## 2014-07-19 MED ORDER — PREDNISONE 20 MG PO TABS: ORAL_TABLET | ORAL | Status: AC

## 2014-07-19 MED ORDER — TRAMADOL HCL 50 MG PO TABS: 50.0000 mg | ORAL_TABLET | Freq: Three times a day (TID) | ORAL | Status: AC | PRN

## 2014-07-19 NOTE — Telephone Encounter (Signed)
Natalie Hogan, Tramadol to mask the pain (your inbox), prednisone sent to her [facility] to treat the source of her pain.

## 2014-07-19 NOTE — Telephone Encounter (Signed)
Pt's daughter in law called and states pt is having a gout flare up in her big toe an wants to know what she can take for the pain. Please advise

## 2014-07-19 NOTE — Telephone Encounter (Signed)
Left message on vm

## 2014-08-03 ENCOUNTER — Telehealth: Payer: Self-pay | Admitting: *Deleted

## 2014-08-03 NOTE — Telephone Encounter (Signed)
Pt's son called and states the gout is still in patients toe. He wanted to know if there is another medication that can be rx'd for this

## 2014-08-04 MED ORDER — METHYLPREDNISOLONE (PAK) 4 MG PO TABS: ORAL_TABLET | ORAL | Status: AC

## 2014-08-04 NOTE — Telephone Encounter (Signed)
I'd recommend repeating a corticosteroid taper, this time with methylprednisolone that I've sent to her [facility] and get on Dr. Melvia Heaps's schedule early next week for a injection.  If pain is gone by the morning of this appointment then she may as well cancel it.

## 2014-08-04 NOTE — Telephone Encounter (Signed)
Message left on vm 

## 2014-08-09 ENCOUNTER — Telehealth: Payer: Self-pay | Admitting: Family Medicine

## 2014-08-09 DIAGNOSIS — L602 Onychogryphosis: Secondary | ICD-10-CM

## 2014-08-09 NOTE — Telephone Encounter (Signed)
Referral req

## 2014-08-14 ENCOUNTER — Telehealth: Payer: Self-pay | Admitting: *Deleted

## 2014-08-14 DIAGNOSIS — M170 Bilateral primary osteoarthritis of knee: Secondary | ICD-10-CM

## 2014-08-14 NOTE — Telephone Encounter (Signed)
Son called and states pt went to the hospital because she fell because of her knee. She was dx with arthritis. The son would like to get her set up for PT to come out to the house. Will put in a referral for home health to come out and asses

## 2014-08-15 ENCOUNTER — Ambulatory Visit: Payer: Self-pay | Admitting: Podiatry

## 2014-08-16 ENCOUNTER — Telehealth: Payer: Self-pay | Admitting: *Deleted

## 2014-08-16 NOTE — Telephone Encounter (Signed)
Per son pt currently has tramadol and oxycontin

## 2014-08-16 NOTE — Telephone Encounter (Signed)
Pt's son called and states PT wanted to know if patient could take an antiinflammatory for knee. Pt is rx'ed  tramadol. Called and left message with son to clarify if pt is taking the tramadol and its not helping and needed additional med

## 2014-08-16 NOTE — Telephone Encounter (Signed)
[facility] called requesting a verbal order or OT to come out to help with ADLs and some shoulder pain.  She was sent home from the hospital with a home health aid but now she's having shoulder pain from having to push up off of chairs & things when standing.

## 2014-08-17 MED ORDER — CELECOXIB 100 MG PO CAPS: 100.0000 mg | ORAL_CAPSULE | Freq: Two times a day (BID) | ORAL | Status: AC | PRN

## 2014-08-17 NOTE — Telephone Encounter (Signed)
Sue Lushndrea, Rx of celebex has been sent to [facility].  I'd recommend avoiding anti-inflammatories such as aleve or ibuprofen since this can impair kidney function.  Can you also relay a verbal [facility] for a Dx of shoulder pain.

## 2014-08-17 NOTE — Telephone Encounter (Signed)
Spoke with Natalie Hogan & gave message..Makhayla Mcmurry

## 2014-08-21 ENCOUNTER — Telehealth: Payer: Self-pay | Admitting: *Deleted

## 2014-08-21 MED ORDER — AMBULATORY NON FORMULARY MEDICATION: Status: AC

## 2014-08-21 NOTE — Telephone Encounter (Signed)
Pt's son left vm requesting to speak directly to you. He thinks that the pt needs to be put into a rehab center or possibly hospice.

## 2014-08-21 NOTE — Telephone Encounter (Signed)
Fayne NorrieAndrea, Spoke with son, can you please call [facility] to request a hospice referral, phone number on Rx in your inbox.

## 2014-08-21 NOTE — Telephone Encounter (Signed)
Now completely dependent on family members for all ADLs.

## 2014-08-22 ENCOUNTER — Telehealth: Payer: Self-pay | Admitting: Family Medicine

## 2014-08-22 NOTE — Telephone Encounter (Signed)
Order was faxed this a.m 

## 2014-08-22 NOTE — Telephone Encounter (Signed)
Spoke with pt's son and he states that he has taken pt to the hospital because she was not eating or drinking and he was afraid that she has become dehydrated. [facility] and they can even go out to the hospital.

## 2014-08-22 NOTE — Telephone Encounter (Signed)
Mr. Dewitt RotaWeygandt called.  He is still waiting to hear from us regarding Hospice coming out to do an evaluation on his. He sounded  agitated because he nor his wife is able to lift her. If he does not hear from us, he said he is going to send her back to the hospital.

## 2014-08-22 NOTE — Telephone Encounter (Signed)
Order was faxed this am to [facility]

## 2014-08-24 DIAGNOSIS — M25511 Pain in right shoulder: Secondary | ICD-10-CM | POA: Diagnosis not present

## 2014-08-24 DIAGNOSIS — M170 Bilateral primary osteoarthritis of knee: Secondary | ICD-10-CM | POA: Diagnosis not present

## 2014-08-24 DIAGNOSIS — I5032 Chronic diastolic (congestive) heart failure: Secondary | ICD-10-CM | POA: Diagnosis not present

## 2014-08-24 DIAGNOSIS — I10 Essential (primary) hypertension: Secondary | ICD-10-CM | POA: Diagnosis not present

## 2014-08-28 ENCOUNTER — Telehealth: Payer: Self-pay | Admitting: Family Medicine

## 2014-08-29 NOTE — Telephone Encounter (Signed)
Pt has passed away.  

## 2014-09-01 ENCOUNTER — Telehealth: Payer: Self-pay | Admitting: Family Medicine

## 2014-09-01 NOTE — Telephone Encounter (Signed)
Opened for care everywhere 

## 2014-09-17 NOTE — Telephone Encounter (Signed)
Son called. He got a call stating that his Mom could not get a refill on a rx but  his Mom is now  [facility].

## 2014-09-17 DEATH — deceased

## 2016-06-01 IMAGING — US US EXTREM LOW VENOUS BILAT
1 series · 13 of 24 positions shown · non-contrast
Comparison: None.

CLINICAL DATA: Left lower leg and swelling and redness.



[Series 1: us extrem low venous bilat · 0.07mm/px · 13 of 49 slices shown]
[im 1/49]
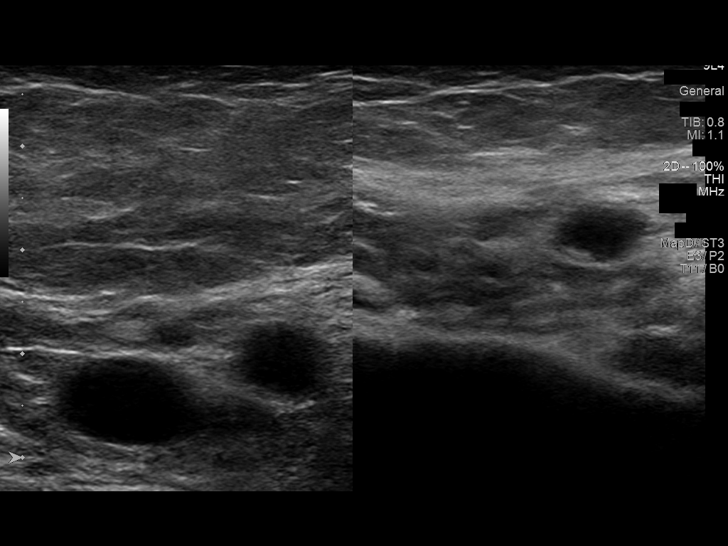
[im 5/49]
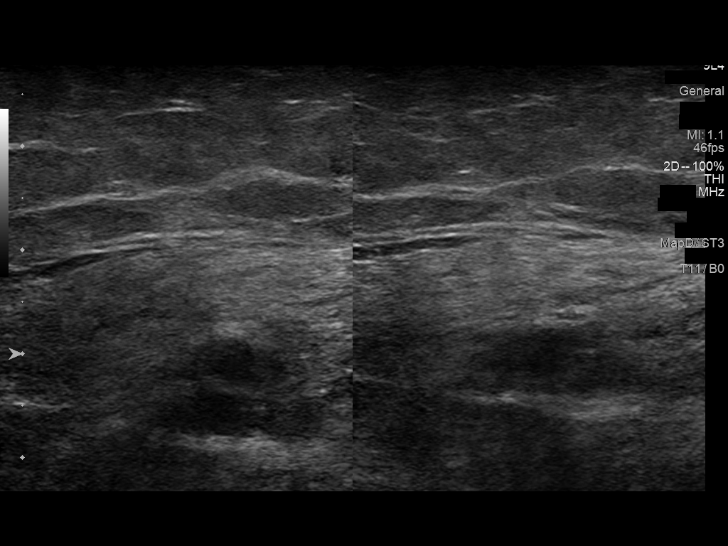
[im 9/49]
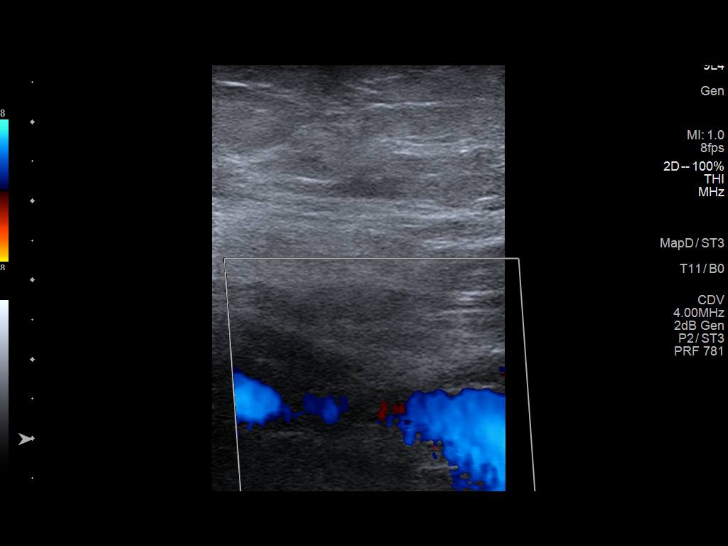
[im 13/49]
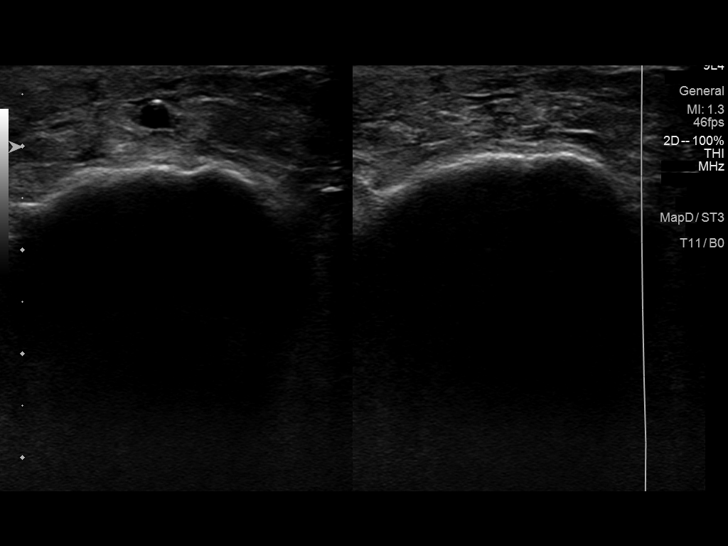
[im 17/49]
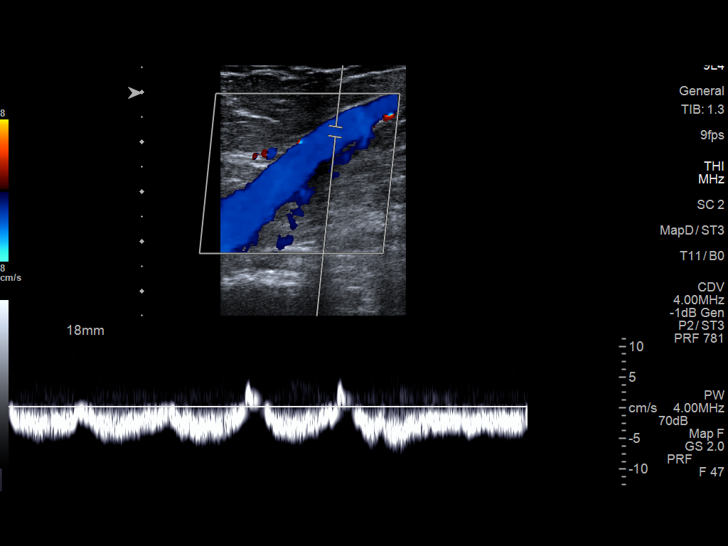
[im 21/49]
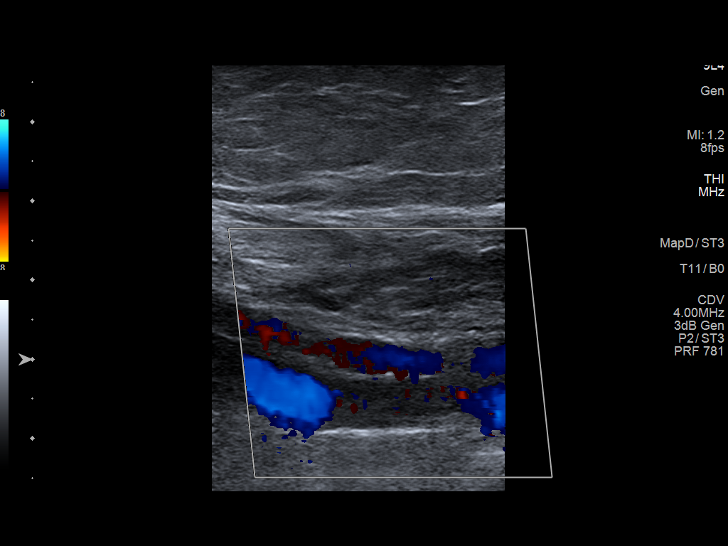
[im 26/49]
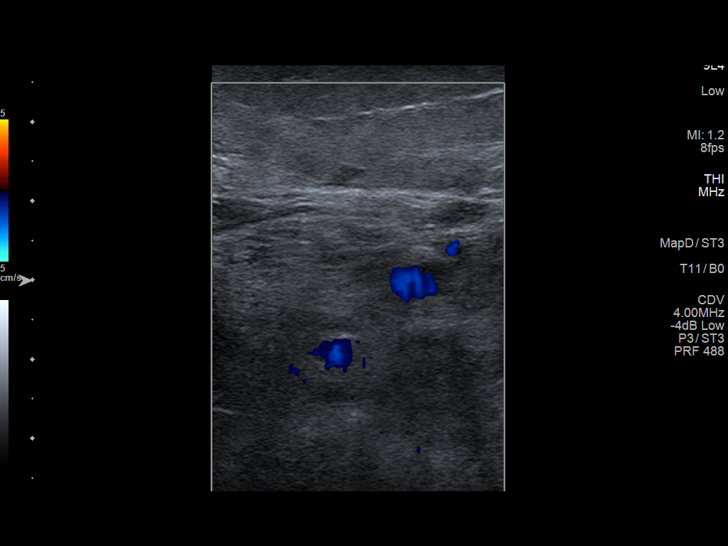
[im 28/49]
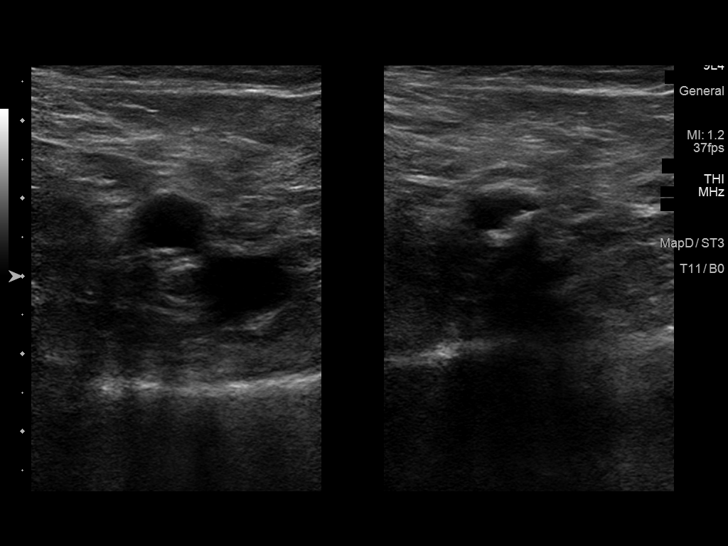
[im 32/49]
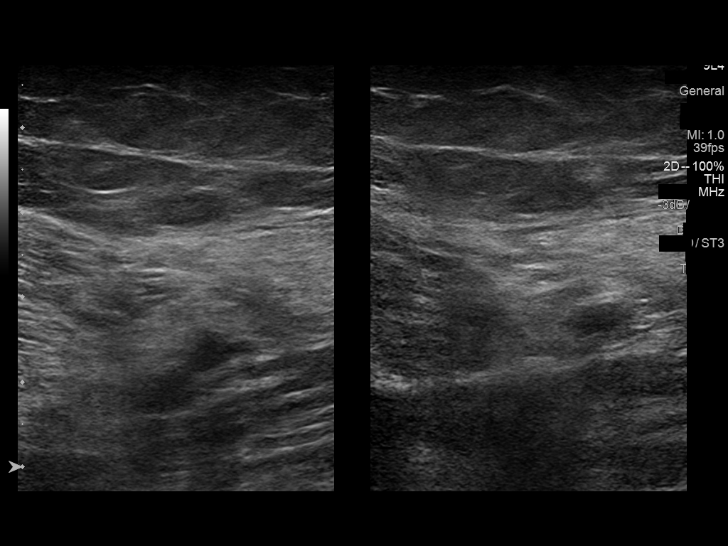
[im 36/49]
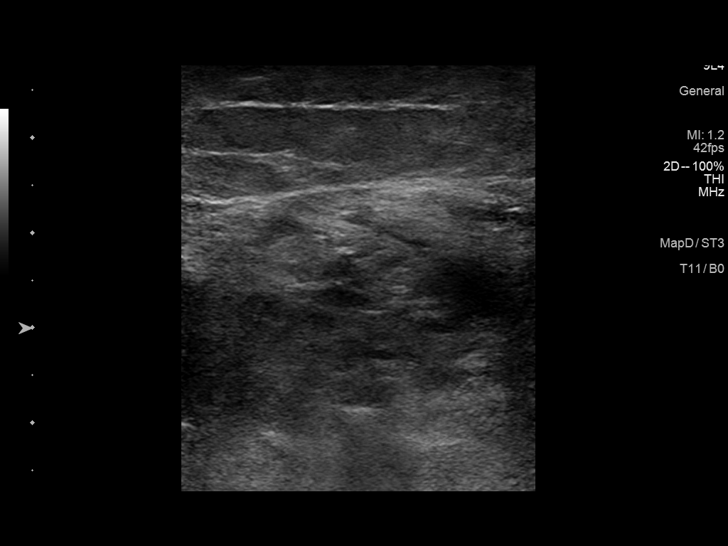
[im 40/49]
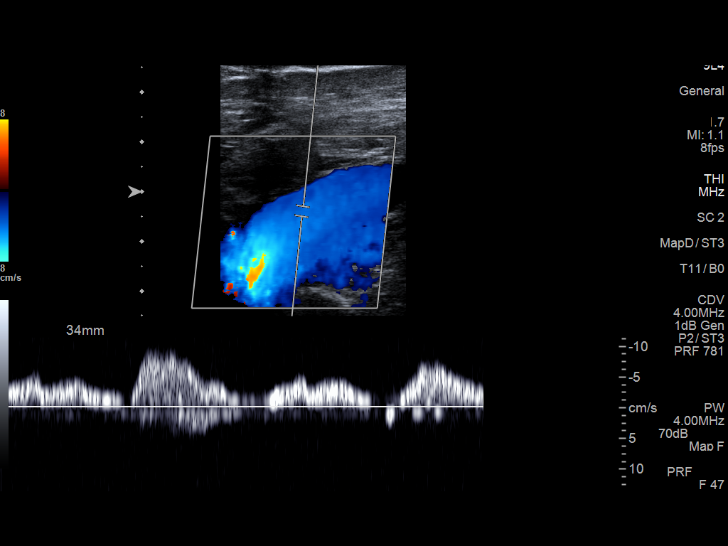
[im 44/49]
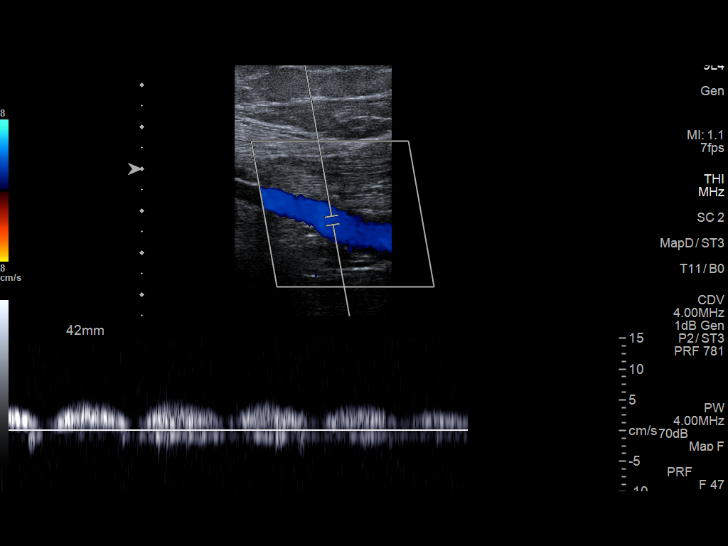
[im 49/49]
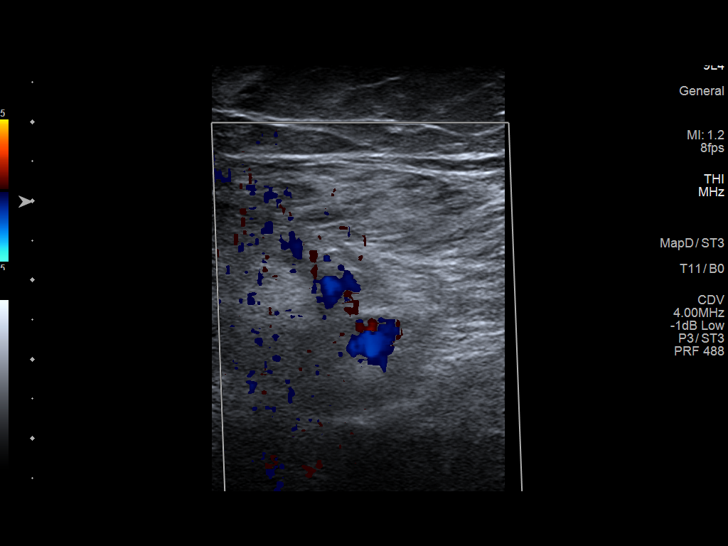

[13 of 24 positions shown; findings below may reference images not displayed]

FINDINGS: RIGHT LOWER EXTREMITY

Common Femoral Vein: No evidence of thrombus. Normal
compressibility, respiratory phasicity and response to augmentation.

Saphenofemoral Junction: No evidence of thrombus. Normal
compressibility and flow on color Doppler imaging.

Profunda Femoral Vein: No evidence of thrombus. Normal
compressibility and flow on color Doppler imaging.

Femoral Vein: No evidence of thrombus. Normal compressibility,
respiratory phasicity and response to augmentation.

Popliteal Vein: No evidence of thrombus. Normal compressibility,
respiratory phasicity and response to augmentation.

Calf Veins: No evidence of thrombus. Normal compressibility and flow
on color Doppler imaging.

Superficial Great Saphenous Vein: No evidence of thrombus. Normal
compressibility and flow on color Doppler imaging.

Venous Reflux:  None.

Other Findings:  None.

LEFT LOWER EXTREMITY

Common Femoral Vein: No evidence of thrombus. Normal
compressibility, respiratory phasicity and response to augmentation.

Saphenofemoral Junction: No evidence of thrombus. Normal
compressibility and flow on color Doppler imaging.

Profunda Femoral Vein: No evidence of thrombus. Normal
compressibility and flow on color Doppler imaging.

Femoral Vein: There is partial occlusion of the distal left femoral
vein. There is blood flow around the thrombus.

Popliteal Vein: No evidence of thrombus. Normal compressibility,
respiratory phasicity and response to augmentation.

Calf Veins: No evidence of thrombus. Normal compressibility and flow
on color Doppler imaging.

Superficial Great Saphenous Vein: No evidence of thrombus. Normal
compressibility and flow on color Doppler imaging.

Venous Reflux:  None.

Other Findings:  None.
IMPRESSION: Deep venous thrombosis of the distal left femoral vein,
nonocclusive. No evidence of venous thrombosis of the right leg.
